# Patient Record
Sex: Female | Born: 1975 | Race: Black or African American | Hispanic: No | Marital: Single | Smoking: Never smoker
Health system: Southern US, Community
[De-identification: ages and names within clinical notes are randomized; demographics above are authoritative.]

## PROBLEM LIST (undated history)

## (undated) DIAGNOSIS — D219 Benign neoplasm of connective and other soft tissue, unspecified: Secondary | ICD-10-CM

## (undated) DIAGNOSIS — G4730 Sleep apnea, unspecified: Secondary | ICD-10-CM

## (undated) DIAGNOSIS — I10 Essential (primary) hypertension: Secondary | ICD-10-CM

## (undated) DIAGNOSIS — D649 Anemia, unspecified: Secondary | ICD-10-CM

## (undated) DIAGNOSIS — T7840XA Allergy, unspecified, initial encounter: Secondary | ICD-10-CM

## (undated) HISTORY — DX: Anemia, unspecified: D64.9

## (undated) HISTORY — DX: Allergy, unspecified, initial encounter: T78.40XA

## (undated) HISTORY — DX: Essential (primary) hypertension: I10

## (undated) HISTORY — DX: Sleep apnea, unspecified: G47.30

---

## 2001-08-26 ENCOUNTER — Other Ambulatory Visit: Admission: RE | Admit: 2001-08-26 | Discharge: 2001-08-26 | Payer: Self-pay | Admitting: Obstetrics and Gynecology

## 2001-10-08 ENCOUNTER — Ambulatory Visit (HOSPITAL_COMMUNITY): Admission: RE | Admit: 2001-10-08 | Discharge: 2001-10-08 | Payer: Self-pay | Admitting: Obstetrics and Gynecology

## 2001-10-08 ENCOUNTER — Encounter: Payer: Self-pay | Admitting: Obstetrics and Gynecology

## 2002-03-04 ENCOUNTER — Other Ambulatory Visit: Admission: RE | Admit: 2002-03-04 | Discharge: 2002-03-04 | Payer: Self-pay | Admitting: Obstetrics and Gynecology

## 2002-10-02 ENCOUNTER — Other Ambulatory Visit: Admission: RE | Admit: 2002-10-02 | Discharge: 2002-10-02 | Payer: Self-pay | Admitting: Obstetrics and Gynecology

## 2002-10-07 ENCOUNTER — Ambulatory Visit (HOSPITAL_COMMUNITY): Admission: RE | Admit: 2002-10-07 | Discharge: 2002-10-07 | Payer: Self-pay | Admitting: Internal Medicine

## 2002-10-07 ENCOUNTER — Encounter: Payer: Self-pay | Admitting: Internal Medicine

## 2003-03-03 ENCOUNTER — Other Ambulatory Visit: Admission: RE | Admit: 2003-03-03 | Discharge: 2003-03-03 | Payer: Self-pay | Admitting: Obstetrics and Gynecology

## 2011-04-11 HISTORY — PX: ROBOT ASSISTED MYOMECTOMY: SHX5142

## 2011-04-11 HISTORY — PX: BILATERAL SALPINGECTOMY: SHX5743

## 2011-09-22 ENCOUNTER — Other Ambulatory Visit (HOSPITAL_COMMUNITY): Payer: Self-pay | Admitting: Obstetrics and Gynecology

## 2011-09-22 DIAGNOSIS — N979 Female infertility, unspecified: Secondary | ICD-10-CM

## 2011-09-25 ENCOUNTER — Ambulatory Visit (HOSPITAL_COMMUNITY)
Admission: RE | Admit: 2011-09-25 | Discharge: 2011-09-25 | Disposition: A | Discharge status: Home / Self Care | Payer: 59 | Source: Ambulatory Visit | Attending: Obstetrics and Gynecology | Admitting: Obstetrics and Gynecology

## 2011-09-25 DIAGNOSIS — N979 Female infertility, unspecified: Secondary | ICD-10-CM

## 2011-09-25 MED ORDER — IOHEXOL 300 MG/ML  SOLN: 17.0000 mL | Freq: Once | INTRAMUSCULAR | Status: AC | PRN

## 2012-10-08 ENCOUNTER — Other Ambulatory Visit (HOSPITAL_COMMUNITY)
Admission: RE | Admit: 2012-10-08 | Discharge: 2012-10-08 | Disposition: A | Discharge status: Home / Self Care | Payer: 59 | Source: Ambulatory Visit | Attending: Obstetrics and Gynecology | Admitting: Obstetrics and Gynecology

## 2012-10-08 ENCOUNTER — Other Ambulatory Visit: Payer: Self-pay | Admitting: Obstetrics and Gynecology

## 2012-10-08 DIAGNOSIS — Z01419 Encounter for gynecological examination (general) (routine) without abnormal findings: Secondary | ICD-10-CM | POA: Insufficient documentation

## 2012-10-08 DIAGNOSIS — N760 Acute vaginitis: Secondary | ICD-10-CM | POA: Insufficient documentation

## 2012-10-08 DIAGNOSIS — Z1151 Encounter for screening for human papillomavirus (HPV): Secondary | ICD-10-CM | POA: Insufficient documentation

## 2015-02-08 ENCOUNTER — Other Ambulatory Visit: Payer: Self-pay | Admitting: Obstetrics and Gynecology

## 2015-02-08 DIAGNOSIS — D259 Leiomyoma of uterus, unspecified: Secondary | ICD-10-CM

## 2015-02-10 ENCOUNTER — Other Ambulatory Visit: Payer: Self-pay | Admitting: Obstetrics and Gynecology

## 2015-02-10 ENCOUNTER — Ambulatory Visit
Admission: RE | Admit: 2015-02-10 | Discharge: 2015-02-10 | Disposition: A | Discharge status: Home / Self Care | Payer: 59 | Source: Ambulatory Visit | Attending: Obstetrics and Gynecology | Admitting: Obstetrics and Gynecology

## 2015-02-10 ENCOUNTER — Encounter (INDEPENDENT_AMBULATORY_CARE_PROVIDER_SITE_OTHER): Payer: Self-pay

## 2015-02-10 DIAGNOSIS — D259 Leiomyoma of uterus, unspecified: Secondary | ICD-10-CM

## 2015-02-10 NOTE — Consult Note (Signed)
Chief Complaint: Patient was seen in consultation today for symptomatically uterine fibroids at the request of Cousins,Sheronette  Referring Physician(s): Cousins,Sheronette  History of Present Illness: [name] is a 39 y.o. (G0, P0) female with past medical history significant for prior bilateral salpingectomy and myomectomy who presents to the interventional radiology clinic for potential percutaneous treatment options regarding her symptomatically uterine fibroids. The patient is unaccompanied and serves as her own historian.  The patient was initially diagnosed with uterine fibroids in 2012. Patient has a long history of menorrhagia though states this has worsened recently. Her cycles have remained regular with no intermittent bleeding. Her cycles typically last approximate 5 days however the first 1-2 days of which are associated with very heavy bleeding necessitating the change of both pads and tampons every 1-2 hours. She admits to the passing of blood clots. She has been previously diagnosed with anemia and has been on iron supplementation though admits she is poorly compliant with taking iron. She has never received a blood transfusion.  The patient admits to urinary frequency though is otherwise without bulk symptoms. She denies significant pelvic or back pain. No hematuria or dysuria. No change in bowel function. No fever or chills.  Patient had a normal Pap smear on 12/18/2014 as well as a negative endometrial biopsy on 02/04/2015. Pelvic ultrasound performed 01/05/2015 (report only, no images) reports a myomatous uterus with at least 4 fibroids, the largest of which within the posterior aspect of the uterus measures approximately 6.3 cm in diameter.  History reviewed. No pertinent past medical history.  Past Surgical History  Procedure Laterality Date  . Bilateral salpingectomy  2013    along with myomectomy  . Robot assisted myomectomy  2013    for uterine  fibroids; along with salpingectomy    Allergies: Review of patient's allergies indicates no known allergies.  Medications: Prior to Admission medications   Medication Sig Start Date End Date Taking? Authorizing Provider  Multiple Vitamin (MULTIVITAMIN) tablet Take 1 tablet by mouth daily.   Yes Historical Provider, MD     No family history on file.  Social History   Social History  . Marital Status: Single    Spouse Name: N/A  . Number of Children: N/A  . Years of Education: N/A   Social History Main Topics  . Smoking status: None  . Smokeless tobacco: None  . Alcohol Use: None  . Drug Use: None  . Sexual Activity: Not Asked   Other Topics Concern  . None   Social History Narrative  . None    ECOG Status: 0 - Asymptomatic  Review of Systems: A 12 point ROS discussed and pertinent positives are indicated in the HPI above.  All other systems are negative.  Review of Systems  Respiratory: Negative.   Cardiovascular: Negative.   Gastrointestinal: Negative.   Genitourinary: Positive for frequency and menstrual problem. Negative for dysuria, flank pain and difficulty urinating.    Vital Signs: BP 142/85 mmHg  Pulse 101  Temp(Src) 98.4 F (36.9 C)  Resp 20  Ht 5\' 4"  (1.626 m)  Wt 205 lb (92.987 kg)  BMI 35.17 kg/m2  SpO2 100%  Physical Exam  Constitutional: She appears well-developed and well-nourished.  HENT:  Head: Normocephalic and atraumatic.  Cardiovascular: Normal rate, regular rhythm and intact distal pulses.   Easily palpable R CFA, DP and PT pulses  Pulmonary/Chest: Effort normal and breath sounds normal.  Abdominal: Soft. Bowel sounds are normal.  I am unable to palpate the uterine fundus.  Skin: Skin is warm and dry.  Psychiatric: She has a normal mood and affect. Her behavior is normal.  Nursing note and vitals reviewed.   Imaging:  Pelvic ultrasound performed 01/05/2015 (report only, no images) reports a myomatous uterus with at least 4  fibroids, the largest of which within the posterior aspect of the uterus measures approximately 6.3 cm in diameter.  Labs:  CBC: No results for input(s): WBC, HGB, HCT, PLT in the last 8760 hours.  COAGS: No results for input(s): INR, APTT in the last 8760 hours.  BMP: No results for input(s): NA, K, CL, CO2, GLUCOSE, BUN, CALCIUM, CREATININE, GFRNONAA, GFRAA in the last 8760 hours.  Invalid input(s): CMP  Assessment and Plan:  [name] is a 39 y.o. (G0, P0) female with past medical history significant for prior bilateral salpingectomy and myomectomy who presents to the interventional radiology clinic for potential percutaneous treatment options regarding her symptomatically uterine fibroids.  The patient has a long history of menorrhagia though states this has worsened recently. Her cycles have remained regular with no intermittent bleeding. Her cycles typically last approximately 5 days however the first 1-2 days of which are associated with very heavy bleeding necessitating the change of both pads and tampons every 1-2 hours. She admits to the passing of blood clots.  She admits to urinary frequency though is otherwise without bulk symptoms.   Note, the patient had a normal Pap smear on 12/18/2014 as well as a negative endometrial biopsy on 02/04/2015.   Prolonged discussions were held with the patient regarding potential treatment options for her symptomatic uterine fibroids including conservative management and hysterectomy. At the present time, the patient wishes to undergo an intervention for her symptomatic fibroids though wishes to avoid surgery. As such, prolonged discussions were held with the patient regarding the benefits and risks (including but not limited to contrast and radiation exposure, bleeding, vessel injury, nontarget embolization) of uterine fibroid embolization. Given the patient's young age, I also explained to the patient that there is a small likelihood  that she could develop recurrent fibroid related symptoms prior to the onset of menopause which may require either a repeat uterine fibroid embolization versus proceeding with hysterectomy.  Following this prolonged discussion, the patient wishes to pursue uterine fibroid embolization. As such, a contrast enhanced pelvic MRI will be obtained to better evaluate the size, burden and location of the fibroids as well as to ensure fibroid enhancement.  As the patient is employed as a Development worker, community carrier, she wishes to postpone the embolization until the beginning of 2017, following the holiday season. The procedure will be performed at [facility] and required overnight admission for continued observation and PCA usage.  Thank you for this interesting consult.  I greatly enjoyed meeting [name] and look forward to participating in her care.  A copy of this report was sent to the requesting provider on this date.  SignedSandi Mariscal 02/10/2015, 2:44 PM   I spent a total of 30 Minutes in face to face in clinical consultation, greater than 50% of which was counseling/coordinating care for symptomatically uterine fibroids

## 2015-02-21 ENCOUNTER — Other Ambulatory Visit: Payer: 59

## 2015-03-02 ENCOUNTER — Ambulatory Visit
Admission: RE | Admit: 2015-03-02 | Discharge: 2015-03-02 | Disposition: A | Discharge status: Home / Self Care | Payer: 59 | Source: Ambulatory Visit | Attending: Obstetrics and Gynecology | Admitting: Obstetrics and Gynecology

## 2015-03-02 DIAGNOSIS — D259 Leiomyoma of uterus, unspecified: Secondary | ICD-10-CM

## 2015-03-02 MED ORDER — GADOBENATE DIMEGLUMINE 529 MG/ML IV SOLN
17.0000 mL | Freq: Once | INTRAVENOUS | Status: AC | PRN
  Administered 2015-03-02: 17 mL via INTRAVENOUS

## 2015-04-13 ENCOUNTER — Other Ambulatory Visit: Payer: Self-pay | Admitting: Interventional Radiology

## 2015-04-13 DIAGNOSIS — D259 Leiomyoma of uterus, unspecified: Secondary | ICD-10-CM

## 2015-05-03 ENCOUNTER — Other Ambulatory Visit: Payer: Self-pay | Admitting: Radiology

## 2015-05-04 ENCOUNTER — Observation Stay (HOSPITAL_COMMUNITY)
Admission: RE | Admit: 2015-05-04 | Discharge: 2015-05-05 | Disposition: A | Discharge status: Home / Self Care | Payer: 59 | Source: Ambulatory Visit | Attending: Interventional Radiology | Admitting: Interventional Radiology

## 2015-05-04 ENCOUNTER — Ambulatory Visit (HOSPITAL_COMMUNITY)
Admission: RE | Admit: 2015-05-04 | Discharge: 2015-05-04 | Disposition: A | Discharge status: Home / Self Care | Payer: 59 | Source: Ambulatory Visit | Attending: Interventional Radiology | Admitting: Interventional Radiology

## 2015-05-04 ENCOUNTER — Encounter (HOSPITAL_COMMUNITY): Payer: Self-pay | Admitting: Anesthesiology

## 2015-05-04 ENCOUNTER — Encounter (HOSPITAL_COMMUNITY): Payer: Self-pay

## 2015-05-04 DIAGNOSIS — Z9079 Acquired absence of other genital organ(s): Secondary | ICD-10-CM | POA: Insufficient documentation

## 2015-05-04 DIAGNOSIS — D219 Benign neoplasm of connective and other soft tissue, unspecified: Secondary | ICD-10-CM | POA: Diagnosis present

## 2015-05-04 DIAGNOSIS — D259 Leiomyoma of uterus, unspecified: Principal | ICD-10-CM | POA: Insufficient documentation

## 2015-05-04 HISTORY — DX: Benign neoplasm of connective and other soft tissue, unspecified: D21.9

## 2015-05-04 LAB — CBC WITH DIFFERENTIAL/PLATELET
BASOS ABS: 0 10*3/uL (ref 0.0–0.1)
Basophils Relative: 0 %
Eosinophils Absolute: 0.3 10*3/uL (ref 0.0–0.7)
Eosinophils Relative: 4 %
HEMATOCRIT: 33.1 % — AB (ref 36.0–46.0)
HEMOGLOBIN: 10.3 g/dL — AB (ref 12.0–15.0)
LYMPHS PCT: 25 %
Lymphs Abs: 1.6 10*3/uL (ref 0.7–4.0)
MCH: 22.9 pg — ABNORMAL LOW (ref 26.0–34.0)
MCHC: 31.1 g/dL (ref 30.0–36.0)
MCV: 73.6 fL — AB (ref 78.0–100.0)
MONO ABS: 0.4 10*3/uL (ref 0.1–1.0)
MONOS PCT: 6 %
NEUTROS ABS: 4.2 10*3/uL (ref 1.7–7.7)
NEUTROS PCT: 65 %
Platelets: 392 10*3/uL (ref 150–400)
RBC: 4.5 MIL/uL (ref 3.87–5.11)
RDW: 16 % — AB (ref 11.5–15.5)
WBC: 6.4 10*3/uL (ref 4.0–10.5)

## 2015-05-04 LAB — BASIC METABOLIC PANEL
Anion gap: 9 (ref 5–15)
BUN: 9 mg/dL (ref 6–20)
CO2: 24 mmol/L (ref 22–32)
Calcium: 9.3 mg/dL (ref 8.9–10.3)
Chloride: 108 mmol/L (ref 101–111)
Creatinine, Ser: 0.85 mg/dL (ref 0.44–1.00)
GFR calc Af Amer: >60 mL/min (ref >60)
GFR calc non Af Amer: >60 mL/min (ref >60)
GLUCOSE: 97 mg/dL (ref 65–99)
POTASSIUM: 3.6 mmol/L (ref 3.5–5.1)
Sodium: 141 mmol/L (ref 135–145)

## 2015-05-04 LAB — APTT: APTT: 30 s (ref 24–37)

## 2015-05-04 LAB — PROTIME-INR
INR: 1.06 (ref 0.00–1.49)
Prothrombin Time: 14 seconds (ref 11.6–15.2)

## 2015-05-04 LAB — HCG, SERUM, QUALITATIVE: Preg, Serum: NEGATIVE

## 2015-05-04 MED ORDER — PROMETHAZINE HCL 25 MG RE SUPP
25.0000 mg | Freq: Three times a day (TID) | RECTAL | Status: DC | PRN
Start: 2015-05-04 — End: 2015-05-05

## 2015-05-04 MED ORDER — IOHEXOL 300 MG/ML  SOLN
80.0000 mL | Freq: Once | INTRAMUSCULAR | Status: AC | PRN
  Administered 2015-05-04: 52 mL via INTRA_ARTERIAL

## 2015-05-04 MED ORDER — SODIUM CHLORIDE 0.9 % IV SOLN: 250.0000 mL | INTRAVENOUS | Status: DC | PRN

## 2015-05-04 MED ORDER — FENTANYL CITRATE (PF) 100 MCG/2ML IJ SOLN
INTRAMUSCULAR | Status: AC | PRN
  Administered 2015-05-04: 50 ug via INTRAVENOUS
  Administered 2015-05-04 (×2): 25 ug via INTRAVENOUS

## 2015-05-04 MED ORDER — MIDAZOLAM HCL 2 MG/2ML IJ SOLN
INTRAMUSCULAR | Status: AC | PRN
  Administered 2015-05-04: 1 mg via INTRAVENOUS
  Administered 2015-05-04 (×2): 0.5 mg via INTRAVENOUS
  Administered 2015-05-04 (×2): 1 mg via INTRAVENOUS

## 2015-05-04 MED ORDER — DIPHENHYDRAMINE HCL 50 MG/ML IJ SOLN: 12.5000 mg | Freq: Four times a day (QID) | INTRAMUSCULAR | Status: DC | PRN

## 2015-05-04 MED ORDER — CEFAZOLIN SODIUM-DEXTROSE 2-3 GM-% IV SOLR
INTRAVENOUS | Status: AC
  Filled 2015-05-04: qty 50

## 2015-05-04 MED ORDER — ONDANSETRON HCL 4 MG/2ML IJ SOLN
4.0000 mg | Freq: Once | INTRAMUSCULAR | Status: AC
  Administered 2015-05-04: 4 mg via INTRAVENOUS
  Filled 2015-05-04: qty 2

## 2015-05-04 MED ORDER — SODIUM CHLORIDE 0.9 % IV SOLN
INTRAVENOUS | Status: DC
Start: 2015-05-04 — End: 2015-05-04
  Administered 2015-05-04: 500 mL via INTRAVENOUS

## 2015-05-04 MED ORDER — HYDROMORPHONE 1 MG/ML IV SOLN
INTRAVENOUS | Status: AC
Start: 2015-05-04 — End: 2015-05-04
  Filled 2015-05-04: qty 25

## 2015-05-04 MED ORDER — PROMETHAZINE HCL 25 MG PO TABS: 25.0000 mg | ORAL_TABLET | Freq: Three times a day (TID) | ORAL | Status: DC | PRN

## 2015-05-04 MED ORDER — DOCUSATE SODIUM 100 MG PO CAPS
100.0000 mg | ORAL_CAPSULE | Freq: Two times a day (BID) | ORAL | Status: DC
  Administered 2015-05-05 (×2): 100 mg via ORAL
  Filled 2015-05-04 (×3): qty 1

## 2015-05-04 MED ORDER — CEFAZOLIN SODIUM-DEXTROSE 2-3 GM-% IV SOLR
2.0000 g | Freq: Once | INTRAVENOUS | Status: AC
  Administered 2015-05-04: 2 g via INTRAVENOUS
  Filled 2015-05-04: qty 50

## 2015-05-04 MED ORDER — SODIUM CHLORIDE 0.9 % IJ SOLN: 9.0000 mL | INTRAMUSCULAR | Status: DC | PRN

## 2015-05-04 MED ORDER — MIDAZOLAM HCL 2 MG/2ML IJ SOLN
INTRAMUSCULAR | Status: AC
  Filled 2015-05-04: qty 6

## 2015-05-04 MED ORDER — HYDROMORPHONE HCL 2 MG/ML IJ SOLN
INTRAMUSCULAR | Status: AC
Start: 2015-05-04 — End: 2015-05-04
  Filled 2015-05-04: qty 1

## 2015-05-04 MED ORDER — SODIUM CHLORIDE 0.9 % IJ SOLN
3.0000 mL | Freq: Two times a day (BID) | INTRAMUSCULAR | Status: DC
  Administered 2015-05-05: 3 mL via INTRAVENOUS

## 2015-05-04 MED ORDER — NALOXONE HCL 0.4 MG/ML IJ SOLN: 0.4000 mg | INTRAMUSCULAR | Status: DC | PRN

## 2015-05-04 MED ORDER — DIPHENHYDRAMINE HCL 12.5 MG/5ML PO ELIX
12.5000 mg | ORAL_SOLUTION | Freq: Four times a day (QID) | ORAL | Status: DC | PRN
  Filled 2015-05-04: qty 5

## 2015-05-04 MED ORDER — ONDANSETRON HCL 4 MG/2ML IJ SOLN
4.0000 mg | Freq: Four times a day (QID) | INTRAMUSCULAR | Status: DC | PRN
  Administered 2015-05-05: 4 mg via INTRAVENOUS
  Filled 2015-05-04: qty 2

## 2015-05-04 MED ORDER — SODIUM CHLORIDE 0.9 % IJ SOLN: 3.0000 mL | INTRAMUSCULAR | Status: DC | PRN

## 2015-05-04 MED ORDER — HYDROMORPHONE HCL 1 MG/ML IJ SOLN
INTRAMUSCULAR | Status: AC | PRN
  Administered 2015-05-04 (×2): 1 mg via INTRAVENOUS

## 2015-05-04 MED ORDER — HYDROMORPHONE 1 MG/ML IV SOLN
INTRAVENOUS | Status: DC
  Administered 2015-05-04: 10:00:00 via INTRAVENOUS
  Administered 2015-05-04: 1.8 mg via INTRAVENOUS
  Administered 2015-05-05: 2.1 mg via INTRAVENOUS
  Administered 2015-05-05: 1.8 mg via INTRAVENOUS
  Administered 2015-05-05: 2.4 mg via INTRAVENOUS
  Administered 2015-05-05: 0.3 mg via INTRAVENOUS
  Administered 2015-05-05: 3.3 mg via INTRAVENOUS
  Filled 2015-05-04: qty 25

## 2015-05-04 MED ORDER — FENTANYL CITRATE (PF) 100 MCG/2ML IJ SOLN
INTRAMUSCULAR | Status: AC
  Filled 2015-05-04: qty 4

## 2015-05-04 MED ORDER — KETOROLAC TROMETHAMINE 30 MG/ML IJ SOLN
30.0000 mg | Freq: Once | INTRAMUSCULAR | Status: AC
  Administered 2015-05-04: 30 mg via INTRAVENOUS
  Filled 2015-05-04: qty 1

## 2015-05-04 MED ORDER — LIDOCAINE-EPINEPHRINE 2 %-1:100000 IJ SOLN
INTRAMUSCULAR | Status: AC
  Filled 2015-05-04: qty 1

## 2015-05-04 NOTE — Sedation Documentation (Signed)
R groin level 0, drsg CDI, 3+RDP. Pt transported in bed to rm [id number].

## 2015-05-04 NOTE — Sedation Documentation (Addendum)
R groin level 0, drsg CDI, 3+RDP.

## 2015-05-04 NOTE — Sedation Documentation (Signed)
Bed assignment received [id number].  Attempted to call report, nurse unavailable, will report at bedside.

## 2015-05-04 NOTE — Procedures (Signed)
Post UFE.   No immediate post procedural complications.   EBL: None Keep right leg straight for 4 hrs.    SignedSandi Mariscal [id number] 05/04/2015, 10:30 AM

## 2015-05-04 NOTE — Sedation Documentation (Signed)
R groin level 0, drsg CDI, 3+RDP.

## 2015-05-04 NOTE — Sedation Documentation (Signed)
Awaiting bed assignment.  Spoke with Margo in placement.  Room being cleaned.

## 2015-05-04 NOTE — Progress Notes (Signed)
Patient ID: [name], female   DOB: April 10, 1976, 40 y.o.   MRN: [id number]    Referring Physician(s): Cousins,S  Chief Complaint:  Uterine fibroids, menorrhagia  Subjective:  Pt doing well; has some intermittent pelvic cramping as expected; denies N/V or resp difficulties  Allergies: Review of patient's allergies indicates no known allergies.  Medications: Prior to Admission medications   Medication Sig Start Date End Date Taking? Authorizing Provider  Ascorbic Acid (VITAMIN C PO) Take by mouth daily.   Yes Historical Provider, MD  Cholecalciferol (VITAMIN D PO) Take by mouth daily.   Yes Historical Provider, MD  Multiple Vitamin (MULTIVITAMIN) tablet Take 1 tablet by mouth daily.   Yes Historical Provider, MD  VITAMIN E PO Take by mouth daily.   Yes Historical Provider, MD     Vital Signs: BP 135/82 mmHg  Pulse 76  Temp(Src) 98.6 F (37 C) (Oral)  Resp 17  Ht _0  (1.626 m)  Wt 197 lb 8 oz (89.585 kg)  BMI 33.88 kg/m2  SpO2 100%  LMP 04/21/2015 (Approximate)  Physical Exam awake/alert; abd soft, puncture site rt CFA clean and dry, NT, no hematoma, intact distal pulses; foley cath with yellow urine  Imaging: Ir Angiogram Pelvis Selective Or Supraselective  05/04/2015  INDICATION: Symptomatic uterine fibroids. Patient presents for bilateral uterine artery embolization EXAM: Uterine Fibroid Embolization MEDICATIONS: Versed 4 mg IV; Toradol 30 mg IV; Zofran 4 mg IV; Ancef 2 g IV; the antibiotic was administered within one hour of the procedure ANESTHESIA/SEDATION: Fentanyl 100 mcg IV; Versed 4 mg IV; Sedation time 47 minutes The patient was continuously monitored during the procedure by the interventional radiology nurse under my direct supervision. CONTRAST:  52 mL OMNIPAQUE IOHEXOL 300 MG/ML  SOLN FLUOROSCOPY TIME:  11 minutes 54 seconds (8,250 mGy) COMPLICATIONS: None immediate. PROCEDURE: Informed consent was obtained from the patient following explanation of  the procedure, risks, benefits and alternatives. The patient understands, agrees and consents for the procedure. All questions were addressed. A time out was performed prior to the initiation of the procedure. Maximal barrier sterile technique utilized including caps, mask, sterile gowns, sterile gloves, large sterile drape, hand hygiene, and Betadine prep. The right femoral head was marked fluoroscopically. Under sterile conditions and local anesthesia, the right common femoral artery access was performed with a micropuncture needle. Under direct ultrasound guidance, the right common femoral was accessed with a micropuncture kit. An ultrasound image was saved for documentation purposes. This allowed for placement of a 5-French vascular sheath. A limited arteriogram was performed through the side arm of the sheath confirming appropriate access within the right common femoral artery. The 5-French C2 catheter was utilized to select the contralateral left internal iliac artery. Selective left internal iliac angiogram was performed. The tortuous left uterine artery was identified. Selective catheterization was performed of the left uterine artery with a microcatheter and micro guide wire. A selective left uterine angiogram was performed. This demonstrated patency of the left uterine artery. Mild diffuse hypervascularity of the enlarged fibroid uterus. Access was adequate for embolization. For embolization, 1 vial of 500 - 700 micron Embospheres were injected into the left uterine artery. Post embolization angiogram confirms complete stasis of the left uterine vascular territory. Microcatheter was removed. The C2 catheter was retracted and utilized to select the right internal iliac artery. Selective right internal iliac angiogram was performed. The patent right uterine artery was identified. For selective catheterization, the micro catheter and guidewire were utilized to select the right uterine artery.  Selective right  uterine angiogram was performed. This demonstrated patency of the right uterine artery. Catheter position was safe for embolization. Embolization was performed to complete stasis with injection of 1 and 1/3 of vials of 500-700 micron Embospheres. Post embolization angiogram confirms complete stasis of the right uterine vascular territory. At this point, all wires, catheters and sheaths were removed from the patient. Hemostasis was achieved at the right groin access site with The patient tolerated the procedure well without immediate post procedural complication. FINDINGS: Bilateral internal iliac arteriograms demonstrates conventional configuration of the bilateral internal iliac arteries, both of which give rise to hypertrophied uterine arteries which supply a myomatous uterus. Note was made of a slight right sided uterine artery dominance. Completion arteriograms following bilateral uterine artery particle embolization demonstrates a technically excellent result with stasis of flow within the bilateral uterine vascular territories. IMPRESSION: Successful bilateral uterine artery embolization (U [facility] E). Electronically Signed   By: Sandi Mariscal M.D.   On: 05/04/2015 10:59   Ir Angiogram Pelvis Selective Or Supraselective  05/04/2015  INDICATION: Symptomatic uterine fibroids. Patient presents for bilateral uterine artery embolization EXAM: Uterine Fibroid Embolization MEDICATIONS: Versed 4 mg IV; Toradol 30 mg IV; Zofran 4 mg IV; Ancef 2 g IV; the antibiotic was administered within one hour of the procedure ANESTHESIA/SEDATION: Fentanyl 100 mcg IV; Versed 4 mg IV; Sedation time 47 minutes The patient was continuously monitored during the procedure by the interventional radiology nurse under my direct supervision. CONTRAST:  52 mL OMNIPAQUE IOHEXOL 300 MG/ML  SOLN FLUOROSCOPY TIME:  11 minutes 54 seconds (3,734 mGy) COMPLICATIONS: None immediate. PROCEDURE: Informed consent was obtained from the patient following  explanation of the procedure, risks, benefits and alternatives. The patient understands, agrees and consents for the procedure. All questions were addressed. A time out was performed prior to the initiation of the procedure. Maximal barrier sterile technique utilized including caps, mask, sterile gowns, sterile gloves, large sterile drape, hand hygiene, and Betadine prep. The right femoral head was marked fluoroscopically. Under sterile conditions and local anesthesia, the right common femoral artery access was performed with a micropuncture needle. Under direct ultrasound guidance, the right common femoral was accessed with a micropuncture kit. An ultrasound image was saved for documentation purposes. This allowed for placement of a 5-French vascular sheath. A limited arteriogram was performed through the side arm of the sheath confirming appropriate access within the right common femoral artery. The 5-French C2 catheter was utilized to select the contralateral left internal iliac artery. Selective left internal iliac angiogram was performed. The tortuous left uterine artery was identified. Selective catheterization was performed of the left uterine artery with a microcatheter and micro guide wire. A selective left uterine angiogram was performed. This demonstrated patency of the left uterine artery. Mild diffuse hypervascularity of the enlarged fibroid uterus. Access was adequate for embolization. For embolization, 1 vial of 500 - 700 micron Embospheres were injected into the left uterine artery. Post embolization angiogram confirms complete stasis of the left uterine vascular territory. Microcatheter was removed. The C2 catheter was retracted and utilized to select the right internal iliac artery. Selective right internal iliac angiogram was performed. The patent right uterine artery was identified. For selective catheterization, the micro catheter and guidewire were utilized to select the right uterine artery.  Selective right uterine angiogram was performed. This demonstrated patency of the right uterine artery. Catheter position was safe for embolization. Embolization was performed to complete stasis with injection of 1 and 1/3 of vials of 500-700  micron Embospheres. Post embolization angiogram confirms complete stasis of the right uterine vascular territory. At this point, all wires, catheters and sheaths were removed from the patient. Hemostasis was achieved at the right groin access site with The patient tolerated the procedure well without immediate post procedural complication. FINDINGS: Bilateral internal iliac arteriograms demonstrates conventional configuration of the bilateral internal iliac arteries, both of which give rise to hypertrophied uterine arteries which supply a myomatous uterus. Note was made of a slight right sided uterine artery dominance. Completion arteriograms following bilateral uterine artery particle embolization demonstrates a technically excellent result with stasis of flow within the bilateral uterine vascular territories. IMPRESSION: Successful bilateral uterine artery embolization ([facility] E). Electronically Signed   By: Sandi Mariscal M.D.   On: 05/04/2015 10:59   Ir Angiogram Selective Each Additional Vessel  05/04/2015  INDICATION: Symptomatic uterine fibroids. Patient presents for bilateral uterine artery embolization EXAM: Uterine Fibroid Embolization MEDICATIONS: Versed 4 mg IV; Toradol 30 mg IV; Zofran 4 mg IV; Ancef 2 g IV; the antibiotic was administered within one hour of the procedure ANESTHESIA/SEDATION: Fentanyl 100 mcg IV; Versed 4 mg IV; Sedation time 47 minutes The patient was continuously monitored during the procedure by the interventional radiology nurse under my direct supervision. CONTRAST:  52 mL OMNIPAQUE IOHEXOL 300 MG/ML  SOLN FLUOROSCOPY TIME:  11 minutes 54 seconds (3,143 mGy) COMPLICATIONS: None immediate. PROCEDURE: Informed consent was obtained from the patient  following explanation of the procedure, risks, benefits and alternatives. The patient understands, agrees and consents for the procedure. All questions were addressed. A time out was performed prior to the initiation of the procedure. Maximal barrier sterile technique utilized including caps, mask, sterile gowns, sterile gloves, large sterile drape, hand hygiene, and Betadine prep. The right femoral head was marked fluoroscopically. Under sterile conditions and local anesthesia, the right common femoral artery access was performed with a micropuncture needle. Under direct ultrasound guidance, the right common femoral was accessed with a micropuncture kit. An ultrasound image was saved for documentation purposes. This allowed for placement of a 5-French vascular sheath. A limited arteriogram was performed through the side arm of the sheath confirming appropriate access within the right common femoral artery. The 5-French C2 catheter was utilized to select the contralateral left internal iliac artery. Selective left internal iliac angiogram was performed. The tortuous left uterine artery was identified. Selective catheterization was performed of the left uterine artery with a microcatheter and micro guide wire. A selective left uterine angiogram was performed. This demonstrated patency of the left uterine artery. Mild diffuse hypervascularity of the enlarged fibroid uterus. Access was adequate for embolization. For embolization, 1 vial of 500 - 700 micron Embospheres were injected into the left uterine artery. Post embolization angiogram confirms complete stasis of the left uterine vascular territory. Microcatheter was removed. The C2 catheter was retracted and utilized to select the right internal iliac artery. Selective right internal iliac angiogram was performed. The patent right uterine artery was identified. For selective catheterization, the micro catheter and guidewire were utilized to select the right uterine  artery. Selective right uterine angiogram was performed. This demonstrated patency of the right uterine artery. Catheter position was safe for embolization. Embolization was performed to complete stasis with injection of 1 and 1/3 of vials of 500-700 micron Embospheres. Post embolization angiogram confirms complete stasis of the right uterine vascular territory. At this point, all wires, catheters and sheaths were removed from the patient. Hemostasis was achieved at the right groin access site with  The patient tolerated the procedure well without immediate post procedural complication. FINDINGS: Bilateral internal iliac arteriograms demonstrates conventional configuration of the bilateral internal iliac arteries, both of which give rise to hypertrophied uterine arteries which supply a myomatous uterus. Note was made of a slight right sided uterine artery dominance. Completion arteriograms following bilateral uterine artery particle embolization demonstrates a technically excellent result with stasis of flow within the bilateral uterine vascular territories. IMPRESSION: Successful bilateral uterine artery embolization ([facility] E). Electronically Signed   By: Sandi Mariscal M.D.   On: 05/04/2015 10:59   Ir Angiogram Selective Each Additional Vessel  05/04/2015  INDICATION: Symptomatic uterine fibroids. Patient presents for bilateral uterine artery embolization EXAM: Uterine Fibroid Embolization MEDICATIONS: Versed 4 mg IV; Toradol 30 mg IV; Zofran 4 mg IV; Ancef 2 g IV; the antibiotic was administered within one hour of the procedure ANESTHESIA/SEDATION: Fentanyl 100 mcg IV; Versed 4 mg IV; Sedation time 47 minutes The patient was continuously monitored during the procedure by the interventional radiology nurse under my direct supervision. CONTRAST:  52 mL OMNIPAQUE IOHEXOL 300 MG/ML  SOLN FLUOROSCOPY TIME:  11 minutes 54 seconds (3,086 mGy) COMPLICATIONS: None immediate. PROCEDURE: Informed consent was obtained from the  patient following explanation of the procedure, risks, benefits and alternatives. The patient understands, agrees and consents for the procedure. All questions were addressed. A time out was performed prior to the initiation of the procedure. Maximal barrier sterile technique utilized including caps, mask, sterile gowns, sterile gloves, large sterile drape, hand hygiene, and Betadine prep. The right femoral head was marked fluoroscopically. Under sterile conditions and local anesthesia, the right common femoral artery access was performed with a micropuncture needle. Under direct ultrasound guidance, the right common femoral was accessed with a micropuncture kit. An ultrasound image was saved for documentation purposes. This allowed for placement of a 5-French vascular sheath. A limited arteriogram was performed through the side arm of the sheath confirming appropriate access within the right common femoral artery. The 5-French C2 catheter was utilized to select the contralateral left internal iliac artery. Selective left internal iliac angiogram was performed. The tortuous left uterine artery was identified. Selective catheterization was performed of the left uterine artery with a microcatheter and micro guide wire. A selective left uterine angiogram was performed. This demonstrated patency of the left uterine artery. Mild diffuse hypervascularity of the enlarged fibroid uterus. Access was adequate for embolization. For embolization, 1 vial of 500 - 700 micron Embospheres were injected into the left uterine artery. Post embolization angiogram confirms complete stasis of the left uterine vascular territory. Microcatheter was removed. The C2 catheter was retracted and utilized to select the right internal iliac artery. Selective right internal iliac angiogram was performed. The patent right uterine artery was identified. For selective catheterization, the micro catheter and guidewire were utilized to select the right  uterine artery. Selective right uterine angiogram was performed. This demonstrated patency of the right uterine artery. Catheter position was safe for embolization. Embolization was performed to complete stasis with injection of 1 and 1/3 of vials of 500-700 micron Embospheres. Post embolization angiogram confirms complete stasis of the right uterine vascular territory. At this point, all wires, catheters and sheaths were removed from the patient. Hemostasis was achieved at the right groin access site with The patient tolerated the procedure well without immediate post procedural complication. FINDINGS: Bilateral internal iliac arteriograms demonstrates conventional configuration of the bilateral internal iliac arteries, both of which give rise to hypertrophied uterine arteries which supply a  myomatous uterus. Note was made of a slight right sided uterine artery dominance. Completion arteriograms following bilateral uterine artery particle embolization demonstrates a technically excellent result with stasis of flow within the bilateral uterine vascular territories. IMPRESSION: Successful bilateral uterine artery embolization ([facility] E). Electronically Signed   By: Sandi Mariscal M.D.   On: 05/04/2015 10:59   Ir US Guide Vasc Access Right  05/04/2015  INDICATION: Symptomatic uterine fibroids. Patient presents for bilateral uterine artery embolization EXAM: Uterine Fibroid Embolization MEDICATIONS: Versed 4 mg IV; Toradol 30 mg IV; Zofran 4 mg IV; Ancef 2 g IV; the antibiotic was administered within one hour of the procedure ANESTHESIA/SEDATION: Fentanyl 100 mcg IV; Versed 4 mg IV; Sedation time 47 minutes The patient was continuously monitored during the procedure by the interventional radiology nurse under my direct supervision. CONTRAST:  52 mL OMNIPAQUE IOHEXOL 300 MG/ML  SOLN FLUOROSCOPY TIME:  11 minutes 54 seconds (8,416 mGy) COMPLICATIONS: None immediate. PROCEDURE: Informed consent was obtained from the patient  following explanation of the procedure, risks, benefits and alternatives. The patient understands, agrees and consents for the procedure. All questions were addressed. A time out was performed prior to the initiation of the procedure. Maximal barrier sterile technique utilized including caps, mask, sterile gowns, sterile gloves, large sterile drape, hand hygiene, and Betadine prep. The right femoral head was marked fluoroscopically. Under sterile conditions and local anesthesia, the right common femoral artery access was performed with a micropuncture needle. Under direct ultrasound guidance, the right common femoral was accessed with a micropuncture kit. An ultrasound image was saved for documentation purposes. This allowed for placement of a 5-French vascular sheath. A limited arteriogram was performed through the side arm of the sheath confirming appropriate access within the right common femoral artery. The 5-French C2 catheter was utilized to select the contralateral left internal iliac artery. Selective left internal iliac angiogram was performed. The tortuous left uterine artery was identified. Selective catheterization was performed of the left uterine artery with a microcatheter and micro guide wire. A selective left uterine angiogram was performed. This demonstrated patency of the left uterine artery. Mild diffuse hypervascularity of the enlarged fibroid uterus. Access was adequate for embolization. For embolization, 1 vial of 500 - 700 micron Embospheres were injected into the left uterine artery. Post embolization angiogram confirms complete stasis of the left uterine vascular territory. Microcatheter was removed. The C2 catheter was retracted and utilized to select the right internal iliac artery. Selective right internal iliac angiogram was performed. The patent right uterine artery was identified. For selective catheterization, the micro catheter and guidewire were utilized to select the right uterine  artery. Selective right uterine angiogram was performed. This demonstrated patency of the right uterine artery. Catheter position was safe for embolization. Embolization was performed to complete stasis with injection of 1 and 1/3 of vials of 500-700 micron Embospheres. Post embolization angiogram confirms complete stasis of the right uterine vascular territory. At this point, all wires, catheters and sheaths were removed from the patient. Hemostasis was achieved at the right groin access site with The patient tolerated the procedure well without immediate post procedural complication. FINDINGS: Bilateral internal iliac arteriograms demonstrates conventional configuration of the bilateral internal iliac arteries, both of which give rise to hypertrophied uterine arteries which supply a myomatous uterus. Note was made of a slight right sided uterine artery dominance. Completion arteriograms following bilateral uterine artery particle embolization demonstrates a technically excellent result with stasis of flow within the bilateral uterine vascular territories. IMPRESSION:  Successful bilateral uterine artery embolization ([facility] E). Electronically Signed   By: Sandi Mariscal M.D.   On: 05/04/2015 10:59   Ir Embo Tumor Organ Ischemia Infarct Inc Guide Roadmapping  05/04/2015  INDICATION: Symptomatic uterine fibroids. Patient presents for bilateral uterine artery embolization EXAM: Uterine Fibroid Embolization MEDICATIONS: Versed 4 mg IV; Toradol 30 mg IV; Zofran 4 mg IV; Ancef 2 g IV; the antibiotic was administered within one hour of the procedure ANESTHESIA/SEDATION: Fentanyl 100 mcg IV; Versed 4 mg IV; Sedation time 47 minutes The patient was continuously monitored during the procedure by the interventional radiology nurse under my direct supervision. CONTRAST:  52 mL OMNIPAQUE IOHEXOL 300 MG/ML  SOLN FLUOROSCOPY TIME:  11 minutes 54 seconds (7,342 mGy) COMPLICATIONS: None immediate. PROCEDURE: Informed consent was  obtained from the patient following explanation of the procedure, risks, benefits and alternatives. The patient understands, agrees and consents for the procedure. All questions were addressed. A time out was performed prior to the initiation of the procedure. Maximal barrier sterile technique utilized including caps, mask, sterile gowns, sterile gloves, large sterile drape, hand hygiene, and Betadine prep. The right femoral head was marked fluoroscopically. Under sterile conditions and local anesthesia, the right common femoral artery access was performed with a micropuncture needle. Under direct ultrasound guidance, the right common femoral was accessed with a micropuncture kit. An ultrasound image was saved for documentation purposes. This allowed for placement of a 5-French vascular sheath. A limited arteriogram was performed through the side arm of the sheath confirming appropriate access within the right common femoral artery. The 5-French C2 catheter was utilized to select the contralateral left internal iliac artery. Selective left internal iliac angiogram was performed. The tortuous left uterine artery was identified. Selective catheterization was performed of the left uterine artery with a microcatheter and micro guide wire. A selective left uterine angiogram was performed. This demonstrated patency of the left uterine artery. Mild diffuse hypervascularity of the enlarged fibroid uterus. Access was adequate for embolization. For embolization, 1 vial of 500 - 700 micron Embospheres were injected into the left uterine artery. Post embolization angiogram confirms complete stasis of the left uterine vascular territory. Microcatheter was removed. The C2 catheter was retracted and utilized to select the right internal iliac artery. Selective right internal iliac angiogram was performed. The patent right uterine artery was identified. For selective catheterization, the micro catheter and guidewire were utilized  to select the right uterine artery. Selective right uterine angiogram was performed. This demonstrated patency of the right uterine artery. Catheter position was safe for embolization. Embolization was performed to complete stasis with injection of 1 and 1/3 of vials of 500-700 micron Embospheres. Post embolization angiogram confirms complete stasis of the right uterine vascular territory. At this point, all wires, catheters and sheaths were removed from the patient. Hemostasis was achieved at the right groin access site with The patient tolerated the procedure well without immediate post procedural complication. FINDINGS: Bilateral internal iliac arteriograms demonstrates conventional configuration of the bilateral internal iliac arteries, both of which give rise to hypertrophied uterine arteries which supply a myomatous uterus. Note was made of a slight right sided uterine artery dominance. Completion arteriograms following bilateral uterine artery particle embolization demonstrates a technically excellent result with stasis of flow within the bilateral uterine vascular territories. IMPRESSION: Successful bilateral uterine artery embolization (U F E). Electronically Signed   By: Sandi Mariscal M.D.   On: 05/04/2015 10:59    Labs:  CBC:  Recent Labs  05/04/15 0750  WBC 6.4  HGB 10.3*  HCT 33.1*  PLT 392    COAGS:  Recent Labs  05/04/15 0750  INR 1.06  APTT 30    BMP:  Recent Labs  05/04/15 0750  NA 141  K 3.6  CL 108  CO2 24  GLUCOSE 97  BUN 9  CALCIUM 9.3  CREATININE 0.85  GFRNONAA >60  GFRAA >60    LIVER FUNCTION TESTS: No results for input(s): BILITOT, AST, ALT, ALKPHOS, PROT, ALBUMIN in the last 8760 hours.  Assessment and Plan: Pt with symptomatic uterine fibroids, s/p bilat [place] 1/24; for overnight obs; dilaudid PCA for pain; hydrate; d/c foley soon; f/u in IR clinic in 4 weeks  Electronically Signed: D. Rowe Robert 05/04/2015, 4:54 PM   I spent a total of 15  minutes at the the patient's bedside AND on the patient's hospital floor or unit, greater than 50% of which was counseling/coordinating care for uterine fibroid embolization

## 2015-05-04 NOTE — Sedation Documentation (Signed)
5 Fr sheath removed from R femoral artery by Cherlyn Labella, RTR.  Hemostasis achieved using manual pressure. R groin level 0, +RDP.

## 2015-05-04 NOTE — H&P (Signed)
Chief Complaint: menorrhagia, uterine fibroids Referring Physician:Dr. Servando Salina HPI: [name] is an 40 y.o. female who was seen by Dr. Pascal Lux in the clinic in November due to bleeding uterine fibroids.  She had an MRI which confirmed this.  She would like to avoid an operation and therefore a bilateral [place] was arranged.  She has presented today for this procedure.  Past Medical History:  Past Medical History  Diagnosis Date  . Fibroids     Past Surgical History:  Past Surgical History  Procedure Laterality Date  . Bilateral salpingectomy  2013    along with myomectomy  . Robot assisted myomectomy  2013    for uterine fibroids; along with salpingectomy    Family History: History reviewed. No pertinent family history.  Social History:  reports that she has never smoked. She does not have any smokeless tobacco history on file. She reports that she drinks about 1.2 oz of alcohol per week. She reports that she does not use illicit drugs.  Allergies: No Known Allergies  Medications:   Medication List    ASK your doctor about these medications        multivitamin tablet  Take 1 tablet by mouth daily.       ROS: Please HPI for pertinent positives, otherwise complete 10 system ROS negative.  Mallampati Score: MD Evaluation Airway: WNL Heart: WNL Abdomen: WNL Chest/ Lungs: WNL ASA  Classification: 2 Mallampati/Airway Score: Two  Physical Exam: BP 163/97 mmHg  Pulse 88  Temp(Src) 98.6 F (37 C) (Oral)  Resp 16  Ht _0  (1.626 m)  Wt 197 lb 8 oz (89.585 kg)  BMI 33.88 kg/m2  SpO2 100%  LMP 04/21/2015 (Approximate) Body mass index is 33.88 kg/(m^2).  General: pleasant, obese black female who is laying in bed in NAD Heart: regular, rate, and rhythm.  Normal s1,s2. No obvious murmurs, gallops, or rubs noted.  Palpable radial and pedal pulses bilaterally Lungs: CTAB, no wheezes, rhonchi, or rales noted.  Respiratory effort nonlabored Abd:  soft, NT, ND, +BS, no masses, hernias, or organomegaly Psych: A&Ox3 with an appropriate affect.   Labs: Results for orders placed or performed during the hospital encounter of 05/04/15 (from the past 48 hour(s))  APTT     Status: None   Collection Time: 05/04/15  7:50 AM  Result Value Ref Range   aPTT 30 24 - 37 seconds  Basic metabolic panel     Status: None   Collection Time: 05/04/15  7:50 AM  Result Value Ref Range   Sodium 141 135 - 145 mmol/L   Potassium 3.6 3.5 - 5.1 mmol/L   Chloride 108 101 - 111 mmol/L   CO2 24 22 - 32 mmol/L   Glucose, Bld 97 65 - 99 mg/dL   BUN 9 6 - 20 mg/dL   Creatinine, Ser 0.85 0.44 - 1.00 mg/dL   Calcium 9.3 8.9 - 10.3 mg/dL   GFR calc non Af Amer >60 >60 mL/min   GFR calc Af Amer >60 >60 mL/min    Comment: (NOTE) The eGFR has been calculated using the CKD EPI equation. This calculation has not been validated in all clinical situations. eGFR's persistently <60 mL/min signify possible Chronic Kidney Disease.    Anion gap 9 5 - 15  CBC with Differential/Platelet     Status: Abnormal   Collection Time: 05/04/15  7:50 AM  Result Value Ref Range   WBC 6.4 4.0 - 10.5 K/uL   RBC 4.50  3.87 - 5.11 MIL/uL   Hemoglobin 10.3 (L) 12.0 - 15.0 g/dL   HCT 33.1 (L) 36.0 - 46.0 %   MCV 73.6 (L) 78.0 - 100.0 fL   MCH 22.9 (L) 26.0 - 34.0 pg   MCHC 31.1 30.0 - 36.0 g/dL   RDW 16.0 (H) 11.5 - 15.5 %   Platelets 392 150 - 400 K/uL   Neutrophils Relative % 65 %   Neutro Abs 4.2 1.7 - 7.7 K/uL   Lymphocytes Relative 25 %   Lymphs Abs 1.6 0.7 - 4.0 K/uL   Monocytes Relative 6 %   Monocytes Absolute 0.4 0.1 - 1.0 K/uL   Eosinophils Relative 4 %   Eosinophils Absolute 0.3 0.0 - 0.7 K/uL   Basophils Relative 0 %   Basophils Absolute 0.0 0.0 - 0.1 K/uL  hCG, serum, qualitative     Status: None   Collection Time: 05/04/15  7:50 AM  Result Value Ref Range   Preg, Serum NEGATIVE NEGATIVE    Comment:        THE SENSITIVITY OF THIS METHODOLOGY IS >10  mIU/mL.   Protime-INR     Status: None   Collection Time: 05/04/15  7:50 AM  Result Value Ref Range   Prothrombin Time 14.0 11.6 - 15.2 seconds   INR 1.06 0.00 - 1.49    Imaging: No results found.  Assessment/Plan 1. Menorrhagia/uterine fibroids  Plan for bilateral [place] today.  Admission overnight for observation and pain control.   The procedure, risks, and complications were explained to the patient, including infection, bleeding, vessel injury, nontarget embolization, nausea, vomiting, or anesthetic complications.  The patient understands and is agreeable to proceed with the procedure.  Thank you for this interesting consult.  I greatly enjoyed meeting [name] and look forward to participating in their care.  A copy of this report was sent to the requesting provider on this date.  Electronically Signed: Henreitta Cea 05/04/2015, 8:53 AM   I spent a total of  30 Minutes   in face to face in clinical consultation, greater than 50% of which was counseling/coordinating care for menorrhagia, uterine fibroids

## 2015-05-04 NOTE — Sedation Documentation (Signed)
Gauze/tegaderm bandage applied to R femoral artery puncture site.  Dressing CDI, groin level 0, 3+RDP.

## 2015-05-05 DIAGNOSIS — D259 Leiomyoma of uterus, unspecified: Secondary | ICD-10-CM | POA: Diagnosis not present

## 2015-05-05 MED ORDER — IBUPROFEN 600 MG PO TABS
600.0000 mg | ORAL_TABLET | Freq: Once | ORAL | Status: AC
  Administered 2015-05-05: 600 mg via ORAL
  Filled 2015-05-05: qty 3
  Filled 2015-05-05: qty 1

## 2015-05-05 NOTE — Progress Notes (Signed)
Discharge instructions and prescriptions given to patient questions answered 

## 2015-05-05 NOTE — Discharge Summary (Signed)
Patient ID: [name] MRN: [id number] DOB/AGE: [birth year] 40 y.o.  Admit date: 05/04/2015 Discharge date: 05/05/2015  Admission Diagnoses: Symptomatic uterine fibroids  Discharge Diagnoses:  Active Problems:   Fibroid   Discharged Condition: good  Hospital Course: [name] is a 40 y.o. (G0, P0) female with past medical history significant for prior bilateral salpingectomy and myomectomy who presented to the interventional radiology clinic to plan percutaneous treatment of her symptomatic uterine fibroids.  The patient was initially diagnosed with uterine fibroids in 2012.   She had a long history of menorrhagia which had worsened recently.  She admited to the passing of blood clots. She has been previously diagnosed with anemia and has been on iron supplementation though admits she is poorly compliant with taking iron. She had never required a blood transfusion.  On 05/04/2015 She underwent Bilateral internal iliac arteriograms which showed both gave rise to hypertrophied uterine arteries which supply a myomatous uterus.  Completion arteriograms following bilateral uterine artery particle embolization demonstrateed a technically excellent result with stasis of flow within the bilateral uterine vascular territories.  The procedure was done by Dr. Pascal Lux.  She did very well post-operatively.  She is tolerating PO.  She has voided without difficulty.  She is ambulating well.  Pain is controlled with Ibuprofen.   Consults: None  Significant Diagnostic Studies: None  Treatments:  INDICATION: Symptomatic uterine fibroids. Patient presents for bilateral uterine artery embolization  EXAM: Uterine Fibroid Embolization  MEDICATIONS: Versed 4 mg IV; Toradol 30 mg IV; Zofran 4 mg IV; Ancef 2 g IV; the antibiotic was administered within one hour of the procedure  ANESTHESIA/SEDATION: Fentanyl 100 mcg IV; Versed 4 mg IV;  Sedation time 47  minutes  The patient was continuously monitored during the procedure by the interventional radiology nurse under my direct supervision.  CONTRAST: 52 mL OMNIPAQUE IOHEXOL 300 MG/ML SOLN  FLUOROSCOPY TIME: 11 minutes 54 seconds (9,379 mGy)  COMPLICATIONS: None immediate.  PROCEDURE: Informed consent was obtained from the patient following explanation of the procedure, risks, benefits and alternatives. The patient understands, agrees and consents for the procedure. All questions were addressed. A time out was performed prior to the initiation of the procedure. Maximal barrier sterile technique utilized including caps, mask, sterile gowns, sterile gloves, large sterile drape, hand hygiene, and Betadine prep.  The right femoral head was marked fluoroscopically. Under sterile conditions and local anesthesia, the right common femoral artery access was performed with a micropuncture needle. Under direct ultrasound guidance, the right common femoral was accessed with a micropuncture kit. An ultrasound image was saved for documentation purposes. This allowed for placement of a 5-French vascular sheath. A limited arteriogram was performed through the side arm of the sheath confirming appropriate access within the right common femoral artery.  The 5-French C2 catheter was utilized to select the contralateral left internal iliac artery. Selective left internal iliac angiogram was performed. The tortuous left uterine artery was identified. Selective catheterization was performed of the left uterine artery with a microcatheter and micro guide wire. A selective left uterine angiogram was performed. This demonstrated patency of the left uterine artery. Mild diffuse hypervascularity of the enlarged fibroid uterus. Access was adequate for embolization. For embolization, 1 vial of 500 - 700 micron Embospheres were injected into the left uterine artery. Post embolization angiogram  confirms complete stasis of the left uterine vascular territory. Microcatheter was removed.  The C2 catheter was retracted and utilized to select the right internal iliac artery. Selective right  internal iliac angiogram was performed. The patent right uterine artery was identified. For selective catheterization, the micro catheter and guidewire were utilized to select the right uterine artery. Selective right uterine angiogram was performed. This demonstrated patency of the right uterine artery. Catheter position was safe for embolization. Embolization was performed to complete stasis with injection of 1 and 1/3 of vials of 500-700 micron Embospheres. Post embolization angiogram confirms complete stasis of the right uterine vascular territory.  At this point, all wires, catheters and sheaths were removed from the patient. Hemostasis was achieved at the right groin access site with  The patient tolerated the procedure well without immediate post procedural complication.  FINDINGS: Bilateral internal iliac arteriograms demonstrates conventional configuration of the bilateral internal iliac arteries, both of which give rise to hypertrophied uterine arteries which supply a myomatous uterus. Note was made of a slight right sided uterine artery dominance.  Completion arteriograms following bilateral uterine artery particle embolization demonstrates a technically excellent result with stasis of flow within the bilateral uterine vascular territories.  IMPRESSION: Successful bilateral uterine artery embolization (U F E).   Electronically Signed  By: Sandi Mariscal M.D.  On: 05/04/2015 10:59  Discharge Exam: Blood pressure 152/84, pulse 90, temperature 98.3 F (36.8 C), temperature source Oral, resp. rate 18, height 5' 4"  (1.626 m), weight 197 lb 8 oz (89.585 kg), last menstrual period 04/21/2015, SpO2 98 %.   Awake and Alert Abdomen soft, non-tender, non-distended. Lungs  CTA Heart RRR Groin stick ok, no hematoma, no pseudoaneurysm.  Disposition: She is ready for discharge home today.  She will follow up with Dr. Pascal Lux in the clinic in 2-4 weeks.  Rx: Zofran 8 mg PO q 8 prn nausea and Ibuprofen 600 mg PO q 6 hours with meals x 5 days.     Medication List    TAKE these medications        multivitamin tablet  Take 1 tablet by mouth daily.     VITAMIN C PO  Take by mouth daily.     VITAMIN D PO  Take by mouth daily.     VITAMIN E PO  Take by mouth daily.          Electronically Signed: Murrell Redden PA-C 05/05/2015, 1:48 PM   I have spent Less Than 30 Minutes discharging [place].

## 2015-05-05 NOTE — Care Management Note (Signed)
Case Management Note  Patient Details  Name: [name] MRN: [id number] Date of Birth: 06-26-1975  Subjective/Objective:   40 y/o f admitted w/fibroid. From home.                 Action/Plan:d/c plan home.   Expected Discharge Date:                  Expected Discharge Plan:  Home/Self Care  In-House Referral:     Discharge planning Services  CM Consult  Post Acute Care Choice:    Choice offered to:     DME Arranged:    DME Agency:     HH Arranged:    HH Agency:     Status of Service:  In process, will continue to follow  Medicare Important Message Given:    Date Medicare IM Given:    Medicare IM give by:    Date Additional Medicare IM Given:    Additional Medicare Important Message give by:     If discussed at [place] of Stay Meetings, dates discussed:    Additional Comments:  Dessa Phi, RN 05/05/2015, 10:36 AM

## 2015-05-05 NOTE — Progress Notes (Addendum)
Dilaudid PCA wasted in sink with Cristine Polio RN.  Wasted 8 cc in sink  I verify that Ann Lions RN wasted 8cc of Dilaudid PCA in the sink 05/05/15 Cristine Polio, RN

## 2015-05-05 NOTE — Care Management Note (Signed)
Case Management Note  Patient Details  Name: [name] MRN: [id number] Date of Birth: July 23, 1975  Subjective/Objective:                    Action/Plan:d/c home no needs or orders.   Expected Discharge Date:                  Expected Discharge Plan:  Home/Self Care  In-House Referral:     Discharge planning Services  CM Consult  Post Acute Care Choice:    Choice offered to:     DME Arranged:    DME Agency:     HH Arranged:    [place] Agency:     Status of Service:  Completed, signed off  Medicare Important Message Given:    Date Medicare IM Given:    Medicare IM give by:    Date Additional Medicare IM Given:    Additional Medicare Important Message give by:     If discussed at [place] of Stay Meetings, dates discussed:    Additional Comments:  Dessa Phi, RN 05/05/2015, 2:26 PM

## 2015-05-26 ENCOUNTER — Ambulatory Visit
Admission: RE | Admit: 2015-05-26 | Discharge: 2015-05-26 | Disposition: A | Discharge status: Home / Self Care | Payer: 59 | Source: Ambulatory Visit | Attending: Physician Assistant | Admitting: Physician Assistant

## 2015-05-26 DIAGNOSIS — D259 Leiomyoma of uterus, unspecified: Secondary | ICD-10-CM

## 2015-05-26 NOTE — Progress Notes (Signed)
Patient ID: [name], female   DOB: 10/18/1975, 40 y.o.   MRN: [id number]       Chief Complaint: Post uterine fibroid embolization  Referring Physician(s): Cousins  History of Present Illness: [name] is a 40 y.o. (G0, P0) female with past history significant for prior bilateral salpingectomy and myomectomy who returns to the interventional radiology clinic following technically successful bilateral uterine artery embolization performed 05/04/2015.  In brief, the patient was initially diagnosed with uterine fibroids in 2012. Her main fibroid related complaint surrounds marked menorrhagia including the passage of blood clots though she also admits bulk symptoms including urinary frequency and urgency.  For these symptoms, the patient underwent a successful uterine fibroid embolization on 05/04/2015. The patient was discharged the following day without incident.  The patient states that she has fully recovered from the procedure and is returned to work. Patient reports mild fleeting left lower abdominal/pelvic pain since returned to work though states this has resolved without dedicated intervention.  She is unclear if she has expressed a cycle since the procedure.  No fever or chills. No dysuria or hematuria. No change in bladder or bowel functions.   Past Medical History  Diagnosis Date  . Fibroids     Past Surgical History  Procedure Laterality Date  . Bilateral salpingectomy  2013    along with myomectomy  . Robot assisted myomectomy  2013    for uterine fibroids; along with salpingectomy    Allergies: Review of patient's allergies indicates no known allergies.  Medications: Prior to Admission medications   Medication Sig Start Date End Date Taking? Authorizing Provider  Ascorbic Acid (VITAMIN C PO) Take by mouth daily.    Historical Provider, MD  Cholecalciferol (VITAMIN D PO) Take by mouth daily.    Historical Provider, MD  Multiple Vitamin  (MULTIVITAMIN) tablet Take 1 tablet by mouth daily.    Historical Provider, MD  VITAMIN E PO Take by mouth daily.    Historical Provider, MD     No family history on file.  Social History   Social History  . Marital Status: Married    Spouse Name: N/A  . Number of Children: N/A  . Years of Education: N/A   Social History Main Topics  . Smoking status: Never Smoker   . Smokeless tobacco: Not on file  . Alcohol Use: 1.2 oz/week    2 Glasses of wine per week  . Drug Use: No  . Sexual Activity: Yes    Birth Control/ Protection: None   Other Topics Concern  . Not on file   Social History Narrative    ECOG Status: 0 - Asymptomatic  Review of Systems: A 12 point ROS discussed and pertinent positives are indicated in the HPI above.  All other systems are negative.  Review of Systems  Constitutional: Negative for fever, chills, activity change, appetite change and fatigue.  Respiratory: Negative.   Cardiovascular: Negative.   Genitourinary: Positive for pelvic pain.       Patient admits transient left lower quadrant abdominal/pelvic pain which resolves without dedicated intervention.    Vital Signs: BP 166/74 mmHg  Pulse 97  Temp(Src) 98.6 F (37 C) (Oral)  Resp 14  Ht 5' 4"  (1.626 m)  Wt 195 lb (88.451 kg)  BMI 33.46 kg/m2  SpO2 100%  LMP 04/21/2015 (Approximate)  Physical Exam  Constitutional: She appears well-developed and well-nourished.  Cardiovascular: Normal rate, regular rhythm and intact distal pulses.   Well healed right groin puncture  site.  Pulmonary/Chest: Effort normal and breath sounds normal.  Abdominal: Soft. Bowel sounds are normal.  Nontender to palpation of the lower abdomen.  Psychiatric: She has a normal mood and affect. Her behavior is normal.  Nursing note and vitals reviewed.    Imaging:  Selected images from the patient's preprocedural contrast enhanced pelvic MRI as well as angiographic images from bilateral uterine artery  embolization were reviewed in detail with the patient.   Ir Angiogram Pelvis Selective Or Supraselective  05/04/2015  INDICATION: Symptomatic uterine fibroids. Patient presents for bilateral uterine artery embolization EXAM: Uterine Fibroid Embolization MEDICATIONS: Versed 4 mg IV; Toradol 30 mg IV; Zofran 4 mg IV; Ancef 2 g IV; the antibiotic was administered within one hour of the procedure ANESTHESIA/SEDATION: Fentanyl 100 mcg IV; Versed 4 mg IV; Sedation time 47 minutes The patient was continuously monitored during the procedure by the interventional radiology nurse under my direct supervision. CONTRAST:  52 mL OMNIPAQUE IOHEXOL 300 MG/ML  SOLN FLUOROSCOPY TIME:  11 minutes 54 seconds (8,502 mGy) COMPLICATIONS: None immediate. PROCEDURE: Informed consent was obtained from the patient following explanation of the procedure, risks, benefits and alternatives. The patient understands, agrees and consents for the procedure. All questions were addressed. A time out was performed prior to the initiation of the procedure. Maximal barrier sterile technique utilized including caps, mask, sterile gowns, sterile gloves, large sterile drape, hand hygiene, and Betadine prep. The right femoral head was marked fluoroscopically. Under sterile conditions and local anesthesia, the right common femoral artery access was performed with a micropuncture needle. Under direct ultrasound guidance, the right common femoral was accessed with a micropuncture kit. An ultrasound image was saved for documentation purposes. This allowed for placement of a 5-French vascular sheath. A limited arteriogram was performed through the side arm of the sheath confirming appropriate access within the right common femoral artery. The 5-French C2 catheter was utilized to select the contralateral left internal iliac artery. Selective left internal iliac angiogram was performed. The tortuous left uterine artery was identified. Selective catheterization  was performed of the left uterine artery with a microcatheter and micro guide wire. A selective left uterine angiogram was performed. This demonstrated patency of the left uterine artery. Mild diffuse hypervascularity of the enlarged fibroid uterus. Access was adequate for embolization. For embolization, 1 vial of 500 - 700 micron Embospheres were injected into the left uterine artery. Post embolization angiogram confirms complete stasis of the left uterine vascular territory. Microcatheter was removed. The C2 catheter was retracted and utilized to select the right internal iliac artery. Selective right internal iliac angiogram was performed. The patent right uterine artery was identified. For selective catheterization, the micro catheter and guidewire were utilized to select the right uterine artery. Selective right uterine angiogram was performed. This demonstrated patency of the right uterine artery. Catheter position was safe for embolization. Embolization was performed to complete stasis with injection of 1 and 1/3 of vials of 500-700 micron Embospheres. Post embolization angiogram confirms complete stasis of the right uterine vascular territory. At this point, all wires, catheters and sheaths were removed from the patient. Hemostasis was achieved at the right groin access site with The patient tolerated the procedure well without immediate post procedural complication. FINDINGS: Bilateral internal iliac arteriograms demonstrates conventional configuration of the bilateral internal iliac arteries, both of which give rise to hypertrophied uterine arteries which supply a myomatous uterus. Note was made of a slight right sided uterine artery dominance. Completion arteriograms following bilateral uterine artery particle embolization  demonstrates a technically excellent result with stasis of flow within the bilateral uterine vascular territories. IMPRESSION: Successful bilateral uterine artery embolization (U F E).  Electronically Signed   By: Sandi Mariscal M.D.   On: 05/04/2015 10:59   Ir Angiogram Pelvis Selective Or Supraselective  05/04/2015  INDICATION: Symptomatic uterine fibroids. Patient presents for bilateral uterine artery embolization EXAM: Uterine Fibroid Embolization MEDICATIONS: Versed 4 mg IV; Toradol 30 mg IV; Zofran 4 mg IV; Ancef 2 g IV; the antibiotic was administered within one hour of the procedure ANESTHESIA/SEDATION: Fentanyl 100 mcg IV; Versed 4 mg IV; Sedation time 47 minutes The patient was continuously monitored during the procedure by the interventional radiology nurse under my direct supervision. CONTRAST:  52 mL OMNIPAQUE IOHEXOL 300 MG/ML  SOLN FLUOROSCOPY TIME:  11 minutes 54 seconds (1,191 mGy) COMPLICATIONS: None immediate. PROCEDURE: Informed consent was obtained from the patient following explanation of the procedure, risks, benefits and alternatives. The patient understands, agrees and consents for the procedure. All questions were addressed. A time out was performed prior to the initiation of the procedure. Maximal barrier sterile technique utilized including caps, mask, sterile gowns, sterile gloves, large sterile drape, hand hygiene, and Betadine prep. The right femoral head was marked fluoroscopically. Under sterile conditions and local anesthesia, the right common femoral artery access was performed with a micropuncture needle. Under direct ultrasound guidance, the right common femoral was accessed with a micropuncture kit. An ultrasound image was saved for documentation purposes. This allowed for placement of a 5-French vascular sheath. A limited arteriogram was performed through the side arm of the sheath confirming appropriate access within the right common femoral artery. The 5-French C2 catheter was utilized to select the contralateral left internal iliac artery. Selective left internal iliac angiogram was performed. The tortuous left uterine artery was identified. Selective  catheterization was performed of the left uterine artery with a microcatheter and micro guide wire. A selective left uterine angiogram was performed. This demonstrated patency of the left uterine artery. Mild diffuse hypervascularity of the enlarged fibroid uterus. Access was adequate for embolization. For embolization, 1 vial of 500 - 700 micron Embospheres were injected into the left uterine artery. Post embolization angiogram confirms complete stasis of the left uterine vascular territory. Microcatheter was removed. The C2 catheter was retracted and utilized to select the right internal iliac artery. Selective right internal iliac angiogram was performed. The patent right uterine artery was identified. For selective catheterization, the micro catheter and guidewire were utilized to select the right uterine artery. Selective right uterine angiogram was performed. This demonstrated patency of the right uterine artery. Catheter position was safe for embolization. Embolization was performed to complete stasis with injection of 1 and 1/3 of vials of 500-700 micron Embospheres. Post embolization angiogram confirms complete stasis of the right uterine vascular territory. At this point, all wires, catheters and sheaths were removed from the patient. Hemostasis was achieved at the right groin access site with The patient tolerated the procedure well without immediate post procedural complication. FINDINGS: Bilateral internal iliac arteriograms demonstrates conventional configuration of the bilateral internal iliac arteries, both of which give rise to hypertrophied uterine arteries which supply a myomatous uterus. Note was made of a slight right sided uterine artery dominance. Completion arteriograms following bilateral uterine artery particle embolization demonstrates a technically excellent result with stasis of flow within the bilateral uterine vascular territories. IMPRESSION: Successful bilateral uterine artery  embolization (U [facility] E). Electronically Signed   By: Sandi Mariscal M.D.   On:  05/04/2015 10:59   Ir Angiogram Selective Each Additional Vessel  05/04/2015  INDICATION: Symptomatic uterine fibroids. Patient presents for bilateral uterine artery embolization EXAM: Uterine Fibroid Embolization MEDICATIONS: Versed 4 mg IV; Toradol 30 mg IV; Zofran 4 mg IV; Ancef 2 g IV; the antibiotic was administered within one hour of the procedure ANESTHESIA/SEDATION: Fentanyl 100 mcg IV; Versed 4 mg IV; Sedation time 47 minutes The patient was continuously monitored during the procedure by the interventional radiology nurse under my direct supervision. CONTRAST:  52 mL OMNIPAQUE IOHEXOL 300 MG/ML  SOLN FLUOROSCOPY TIME:  11 minutes 54 seconds (0,272 mGy) COMPLICATIONS: None immediate. PROCEDURE: Informed consent was obtained from the patient following explanation of the procedure, risks, benefits and alternatives. The patient understands, agrees and consents for the procedure. All questions were addressed. A time out was performed prior to the initiation of the procedure. Maximal barrier sterile technique utilized including caps, mask, sterile gowns, sterile gloves, large sterile drape, hand hygiene, and Betadine prep. The right femoral head was marked fluoroscopically. Under sterile conditions and local anesthesia, the right common femoral artery access was performed with a micropuncture needle. Under direct ultrasound guidance, the right common femoral was accessed with a micropuncture kit. An ultrasound image was saved for documentation purposes. This allowed for placement of a 5-French vascular sheath. A limited arteriogram was performed through the side arm of the sheath confirming appropriate access within the right common femoral artery. The 5-French C2 catheter was utilized to select the contralateral left internal iliac artery. Selective left internal iliac angiogram was performed. The tortuous left uterine artery was  identified. Selective catheterization was performed of the left uterine artery with a microcatheter and micro guide wire. A selective left uterine angiogram was performed. This demonstrated patency of the left uterine artery. Mild diffuse hypervascularity of the enlarged fibroid uterus. Access was adequate for embolization. For embolization, 1 vial of 500 - 700 micron Embospheres were injected into the left uterine artery. Post embolization angiogram confirms complete stasis of the left uterine vascular territory. Microcatheter was removed. The C2 catheter was retracted and utilized to select the right internal iliac artery. Selective right internal iliac angiogram was performed. The patent right uterine artery was identified. For selective catheterization, the micro catheter and guidewire were utilized to select the right uterine artery. Selective right uterine angiogram was performed. This demonstrated patency of the right uterine artery. Catheter position was safe for embolization. Embolization was performed to complete stasis with injection of 1 and 1/3 of vials of 500-700 micron Embospheres. Post embolization angiogram confirms complete stasis of the right uterine vascular territory. At this point, all wires, catheters and sheaths were removed from the patient. Hemostasis was achieved at the right groin access site with The patient tolerated the procedure well without immediate post procedural complication. FINDINGS: Bilateral internal iliac arteriograms demonstrates conventional configuration of the bilateral internal iliac arteries, both of which give rise to hypertrophied uterine arteries which supply a myomatous uterus. Note was made of a slight right sided uterine artery dominance. Completion arteriograms following bilateral uterine artery particle embolization demonstrates a technically excellent result with stasis of flow within the bilateral uterine vascular territories. IMPRESSION: Successful bilateral  uterine artery embolization (U F E). Electronically Signed   By: Sandi Mariscal M.D.   On: 05/04/2015 10:59   Ir Angiogram Selective Each Additional Vessel  05/04/2015  INDICATION: Symptomatic uterine fibroids. Patient presents for bilateral uterine artery embolization EXAM: Uterine Fibroid Embolization MEDICATIONS: Versed 4 mg IV; Toradol 30 mg IV;  Zofran 4 mg IV; Ancef 2 g IV; the antibiotic was administered within one hour of the procedure ANESTHESIA/SEDATION: Fentanyl 100 mcg IV; Versed 4 mg IV; Sedation time 47 minutes The patient was continuously monitored during the procedure by the interventional radiology nurse under my direct supervision. CONTRAST:  52 mL OMNIPAQUE IOHEXOL 300 MG/ML  SOLN FLUOROSCOPY TIME:  11 minutes 54 seconds (9,518 mGy) COMPLICATIONS: None immediate. PROCEDURE: Informed consent was obtained from the patient following explanation of the procedure, risks, benefits and alternatives. The patient understands, agrees and consents for the procedure. All questions were addressed. A time out was performed prior to the initiation of the procedure. Maximal barrier sterile technique utilized including caps, mask, sterile gowns, sterile gloves, large sterile drape, hand hygiene, and Betadine prep. The right femoral head was marked fluoroscopically. Under sterile conditions and local anesthesia, the right common femoral artery access was performed with a micropuncture needle. Under direct ultrasound guidance, the right common femoral was accessed with a micropuncture kit. An ultrasound image was saved for documentation purposes. This allowed for placement of a 5-French vascular sheath. A limited arteriogram was performed through the side arm of the sheath confirming appropriate access within the right common femoral artery. The 5-French C2 catheter was utilized to select the contralateral left internal iliac artery. Selective left internal iliac angiogram was performed. The tortuous left uterine  artery was identified. Selective catheterization was performed of the left uterine artery with a microcatheter and micro guide wire. A selective left uterine angiogram was performed. This demonstrated patency of the left uterine artery. Mild diffuse hypervascularity of the enlarged fibroid uterus. Access was adequate for embolization. For embolization, 1 vial of 500 - 700 micron Embospheres were injected into the left uterine artery. Post embolization angiogram confirms complete stasis of the left uterine vascular territory. Microcatheter was removed. The C2 catheter was retracted and utilized to select the right internal iliac artery. Selective right internal iliac angiogram was performed. The patent right uterine artery was identified. For selective catheterization, the micro catheter and guidewire were utilized to select the right uterine artery. Selective right uterine angiogram was performed. This demonstrated patency of the right uterine artery. Catheter position was safe for embolization. Embolization was performed to complete stasis with injection of 1 and 1/3 of vials of 500-700 micron Embospheres. Post embolization angiogram confirms complete stasis of the right uterine vascular territory. At this point, all wires, catheters and sheaths were removed from the patient. Hemostasis was achieved at the right groin access site with The patient tolerated the procedure well without immediate post procedural complication. FINDINGS: Bilateral internal iliac arteriograms demonstrates conventional configuration of the bilateral internal iliac arteries, both of which give rise to hypertrophied uterine arteries which supply a myomatous uterus. Note was made of a slight right sided uterine artery dominance. Completion arteriograms following bilateral uterine artery particle embolization demonstrates a technically excellent result with stasis of flow within the bilateral uterine vascular territories. IMPRESSION:  Successful bilateral uterine artery embolization (U [facility] E). Electronically Signed   By: Sandi Mariscal M.D.   On: 05/04/2015 10:59   Ir US Guide Vasc Access Right  05/04/2015  INDICATION: Symptomatic uterine fibroids. Patient presents for bilateral uterine artery embolization EXAM: Uterine Fibroid Embolization MEDICATIONS: Versed 4 mg IV; Toradol 30 mg IV; Zofran 4 mg IV; Ancef 2 g IV; the antibiotic was administered within one hour of the procedure ANESTHESIA/SEDATION: Fentanyl 100 mcg IV; Versed 4 mg IV; Sedation time 47 minutes The patient was continuously monitored during  the procedure by the interventional radiology nurse under my direct supervision. CONTRAST:  52 mL OMNIPAQUE IOHEXOL 300 MG/ML  SOLN FLUOROSCOPY TIME:  11 minutes 54 seconds (0,973 mGy) COMPLICATIONS: None immediate. PROCEDURE: Informed consent was obtained from the patient following explanation of the procedure, risks, benefits and alternatives. The patient understands, agrees and consents for the procedure. All questions were addressed. A time out was performed prior to the initiation of the procedure. Maximal barrier sterile technique utilized including caps, mask, sterile gowns, sterile gloves, large sterile drape, hand hygiene, and Betadine prep. The right femoral head was marked fluoroscopically. Under sterile conditions and local anesthesia, the right common femoral artery access was performed with a micropuncture needle. Under direct ultrasound guidance, the right common femoral was accessed with a micropuncture kit. An ultrasound image was saved for documentation purposes. This allowed for placement of a 5-French vascular sheath. A limited arteriogram was performed through the side arm of the sheath confirming appropriate access within the right common femoral artery. The 5-French C2 catheter was utilized to select the contralateral left internal iliac artery. Selective left internal iliac angiogram was performed. The tortuous left  uterine artery was identified. Selective catheterization was performed of the left uterine artery with a microcatheter and micro guide wire. A selective left uterine angiogram was performed. This demonstrated patency of the left uterine artery. Mild diffuse hypervascularity of the enlarged fibroid uterus. Access was adequate for embolization. For embolization, 1 vial of 500 - 700 micron Embospheres were injected into the left uterine artery. Post embolization angiogram confirms complete stasis of the left uterine vascular territory. Microcatheter was removed. The C2 catheter was retracted and utilized to select the right internal iliac artery. Selective right internal iliac angiogram was performed. The patent right uterine artery was identified. For selective catheterization, the micro catheter and guidewire were utilized to select the right uterine artery. Selective right uterine angiogram was performed. This demonstrated patency of the right uterine artery. Catheter position was safe for embolization. Embolization was performed to complete stasis with injection of 1 and 1/3 of vials of 500-700 micron Embospheres. Post embolization angiogram confirms complete stasis of the right uterine vascular territory. At this point, all wires, catheters and sheaths were removed from the patient. Hemostasis was achieved at the right groin access site with The patient tolerated the procedure well without immediate post procedural complication. FINDINGS: Bilateral internal iliac arteriograms demonstrates conventional configuration of the bilateral internal iliac arteries, both of which give rise to hypertrophied uterine arteries which supply a myomatous uterus. Note was made of a slight right sided uterine artery dominance. Completion arteriograms following bilateral uterine artery particle embolization demonstrates a technically excellent result with stasis of flow within the bilateral uterine vascular territories. IMPRESSION:  Successful bilateral uterine artery embolization (U [facility] E). Electronically Signed   By: Sandi Mariscal M.D.   On: 05/04/2015 10:59   Ir Embo Tumor Organ Ischemia Infarct Inc Guide Roadmapping  05/04/2015  INDICATION: Symptomatic uterine fibroids. Patient presents for bilateral uterine artery embolization EXAM: Uterine Fibroid Embolization MEDICATIONS: Versed 4 mg IV; Toradol 30 mg IV; Zofran 4 mg IV; Ancef 2 g IV; the antibiotic was administered within one hour of the procedure ANESTHESIA/SEDATION: Fentanyl 100 mcg IV; Versed 4 mg IV; Sedation time 47 minutes The patient was continuously monitored during the procedure by the interventional radiology nurse under my direct supervision. CONTRAST:  52 mL OMNIPAQUE IOHEXOL 300 MG/ML  SOLN FLUOROSCOPY TIME:  11 minutes 54 seconds (5,329 mGy) COMPLICATIONS: None immediate. PROCEDURE:  Informed consent was obtained from the patient following explanation of the procedure, risks, benefits and alternatives. The patient understands, agrees and consents for the procedure. All questions were addressed. A time out was performed prior to the initiation of the procedure. Maximal barrier sterile technique utilized including caps, mask, sterile gowns, sterile gloves, large sterile drape, hand hygiene, and Betadine prep. The right femoral head was marked fluoroscopically. Under sterile conditions and local anesthesia, the right common femoral artery access was performed with a micropuncture needle. Under direct ultrasound guidance, the right common femoral was accessed with a micropuncture kit. An ultrasound image was saved for documentation purposes. This allowed for placement of a 5-French vascular sheath. A limited arteriogram was performed through the side arm of the sheath confirming appropriate access within the right common femoral artery. The 5-French C2 catheter was utilized to select the contralateral left internal iliac artery. Selective left internal iliac angiogram was  performed. The tortuous left uterine artery was identified. Selective catheterization was performed of the left uterine artery with a microcatheter and micro guide wire. A selective left uterine angiogram was performed. This demonstrated patency of the left uterine artery. Mild diffuse hypervascularity of the enlarged fibroid uterus. Access was adequate for embolization. For embolization, 1 vial of 500 - 700 micron Embospheres were injected into the left uterine artery. Post embolization angiogram confirms complete stasis of the left uterine vascular territory. Microcatheter was removed. The C2 catheter was retracted and utilized to select the right internal iliac artery. Selective right internal iliac angiogram was performed. The patent right uterine artery was identified. For selective catheterization, the micro catheter and guidewire were utilized to select the right uterine artery. Selective right uterine angiogram was performed. This demonstrated patency of the right uterine artery. Catheter position was safe for embolization. Embolization was performed to complete stasis with injection of 1 and 1/3 of vials of 500-700 micron Embospheres. Post embolization angiogram confirms complete stasis of the right uterine vascular territory. At this point, all wires, catheters and sheaths were removed from the patient. Hemostasis was achieved at the right groin access site with The patient tolerated the procedure well without immediate post procedural complication. FINDINGS: Bilateral internal iliac arteriograms demonstrates conventional configuration of the bilateral internal iliac arteries, both of which give rise to hypertrophied uterine arteries which supply a myomatous uterus. Note was made of a slight right sided uterine artery dominance. Completion arteriograms following bilateral uterine artery particle embolization demonstrates a technically excellent result with stasis of flow within the bilateral uterine  vascular territories. IMPRESSION: Successful bilateral uterine artery embolization (U [facility] E). Electronically Signed   By: Sandi Mariscal M.D.   On: 05/04/2015 10:59    Labs:  CBC:  Recent Labs  05/04/15 0750  WBC 6.4  HGB 10.3*  HCT 33.1*  PLT 392    COAGS:  Recent Labs  05/04/15 0750  INR 1.06  APTT 30    BMP:  Recent Labs  05/04/15 0750  NA 141  K 3.6  CL 108  CO2 24  GLUCOSE 97  BUN 9  CALCIUM 9.3  CREATININE 0.85  GFRNONAA >60  GFRAA >60    Assessment and Plan:  [name] is a 40 y.o. (G0, P0) female with past history significant for prior bilateral salpingectomy and myomectomy who returns to the interventional radiology clinic following technically successful bilateral uterine artery embolization performed 05/04/2015.  The patient has fully recovered from the bilateral uterine artery embolization. She admits to mild transient lower abdominal/pelvic discomfort  though this resolves without dedicated intervention. The patient is otherwise fully recovered from the bilateral uterine artery embolization.  Selected images from the patient's contrast enhanced pelvic MRI as well as intra-procedural angiographic images from the bilateral uterine artery embolization reviewed in detail with the patient.  The patient will be called in approximately 3 months to ensure continued improvement in her fibroid related symptoms. Assuming continued improvement and recovery, the patient will be seen in follow-up consultation approximately 6 months following the procedure (June/July 2017). A contrast enhanced pelvic MRI will only be obtained if the patient were to experience persistent or recurrent symptoms.  The patient was encouraged to call the interventional radiology clinic with any interval questions or concerns.  A copy of this report was sent to the requesting provider on this date.  Electronically Signed: Sandi Mariscal 05/26/2015, 9:49 AM   I spent a total of 15  Minutes in face to face in clinical consultation, greater than 50% of which was counseling/coordinating care for post uterine fibroid embolization

## 2015-07-28 ENCOUNTER — Telehealth: Payer: Self-pay | Admitting: Radiology

## 2015-07-28 NOTE — Telephone Encounter (Signed)
Patient doing well 3 mo s/p [place].    LMP:  07/19/2015.  Patient reports that Sx have greatly improved.  Decrease flow & improvement in urinary frequency.  Prefers to schedule follow up appointment with Dr Pascal Lux at 6 mo s/p [place].  Jamell Laymon Riki Rusk, RN 07/28/2015 3:40 PM

## 2015-08-09 ENCOUNTER — Other Ambulatory Visit: Payer: Self-pay

## 2015-08-09 DIAGNOSIS — Z1231 Encounter for screening mammogram for malignant neoplasm of breast: Secondary | ICD-10-CM

## 2015-08-16 ENCOUNTER — Ambulatory Visit
Admission: RE | Admit: 2015-08-16 | Discharge: 2015-08-16 | Disposition: A | Discharge status: Home / Self Care | Payer: 59 | Source: Ambulatory Visit

## 2015-08-16 DIAGNOSIS — Z1231 Encounter for screening mammogram for malignant neoplasm of breast: Secondary | ICD-10-CM

## 2015-09-16 ENCOUNTER — Other Ambulatory Visit (HOSPITAL_COMMUNITY): Payer: Self-pay | Admitting: Physician Assistant

## 2015-09-16 ENCOUNTER — Other Ambulatory Visit (HOSPITAL_COMMUNITY): Payer: Self-pay | Admitting: Interventional Radiology

## 2015-09-16 DIAGNOSIS — D259 Leiomyoma of uterus, unspecified: Secondary | ICD-10-CM

## 2015-10-26 ENCOUNTER — Other Ambulatory Visit: Payer: 59

## 2015-11-18 ENCOUNTER — Ambulatory Visit
Admission: RE | Admit: 2015-11-18 | Discharge: 2015-11-18 | Disposition: A | Discharge status: Home / Self Care | Payer: 59 | Source: Ambulatory Visit | Attending: Interventional Radiology | Admitting: Interventional Radiology

## 2015-11-18 DIAGNOSIS — D259 Leiomyoma of uterus, unspecified: Secondary | ICD-10-CM

## 2015-11-18 HISTORY — PX: IR GENERIC HISTORICAL: IMG1180011

## 2015-11-18 NOTE — Progress Notes (Signed)
Chief Complaint: Patient was seen in consultation today for 6 month follow up of uterine artery embolization.  Referring Physician(s): Dr. Servando Salina  Supervising Physician: Sandi Mariscal  History of Present Illness: [name] is a 40 y.o. female who underwent successful Uterine artery embolization on 05/04/2015. She has done very well. Had expected cramping discomfort the first few weeks, but this has subsided and resolved. She denies any issues with her femoral arterial access site. Her menses are now regular, and without pain or heavy bleeding. She feels great and is pleased with her outcome. No imaging has been done in the interval. PMHx, meds reviewed and updated.  Past Medical History:  Diagnosis Date  . Fibroids     Past Surgical History:  Procedure Laterality Date  . BILATERAL SALPINGECTOMY  2013   along with myomectomy  . ROBOT ASSISTED MYOMECTOMY  2013   for uterine fibroids; along with salpingectomy    Allergies: Review of patient's allergies indicates no known allergies.  Medications: Prior to Admission medications   Medication Sig Start Date End Date Taking? Authorizing Provider  Ascorbic Acid (VITAMIN C PO) Take by mouth daily.    Historical Provider, MD  Cholecalciferol (VITAMIN D PO) Take by mouth daily.    Historical Provider, MD  Multiple Vitamin (MULTIVITAMIN) tablet Take 1 tablet by mouth daily.    Historical Provider, MD  VITAMIN E PO Take by mouth daily.    Historical Provider, MD     No family history on file.  Social History   Social History  . Marital status: Married    Spouse name: N/A  . Number of children: N/A  . Years of education: N/A   Social History Main Topics  . Smoking status: Never Smoker  . Smokeless tobacco: None  . Alcohol use 1.2 oz/week    2 Glasses of wine per week  . Drug use: No  . Sexual activity: Yes    Birth control/ protection: None   Other Topics Concern  . None   Social History  Narrative  . None    Review of Systems: A 12 point ROS discussed and pertinent positives are indicated in the HPI above.  All other systems are negative.  Review of Systems  Vital Signs: BP 139/74 (BP Location: Right Arm, Patient Position: Sitting, Cuff Size: Normal)   Pulse 92   Temp 98.4 F (36.9 C)   Resp 14   Ht 5\' 4"  (1.626 m)   Wt 195 lb (88.5 kg)   LMP 11/08/2015 (Exact Date)   SpO2 100%   BMI 33.47 kg/m   Physical Exam  Constitutional: She appears well-developed and well-nourished. No distress.  Cardiovascular: Intact distal pulses.   Abdominal: Soft. She exhibits no distension and no mass. There is no tenderness.    Imaging: No results found.  Labs:  CBC:  Recent Labs  05/04/15 0750  WBC 6.4  HGB 10.3*  HCT 33.1*  PLT 392    COAGS:  Recent Labs  05/04/15 0750  INR 1.06  APTT 30    BMP:  Recent Labs  05/04/15 0750  NA 141  K 3.6  CL 108  CO2 24  GLUCOSE 97  BUN 9  CALCIUM 9.3  CREATININE 0.85  GFRNONAA >60  GFRAA >60    Assessment: S/p successful uterine artery/fibroid embolization on 05/04/15   Electronically Signed: Ascencion Dike 11/18/2015, 2:12 PM   Please refer to Dr. Pascal Lux attestation of this note for management and plan.

## 2016-04-13 ENCOUNTER — Encounter: Payer: Self-pay | Admitting: Interventional Radiology

## 2016-08-28 ENCOUNTER — Other Ambulatory Visit: Payer: Self-pay | Admitting: Internal Medicine

## 2016-08-28 DIAGNOSIS — Z1231 Encounter for screening mammogram for malignant neoplasm of breast: Secondary | ICD-10-CM

## 2016-09-13 ENCOUNTER — Ambulatory Visit
Admission: RE | Admit: 2016-09-13 | Discharge: 2016-09-13 | Disposition: A | Discharge status: Home / Self Care | Payer: 59 | Source: Ambulatory Visit | Attending: Internal Medicine | Admitting: Internal Medicine

## 2016-09-13 DIAGNOSIS — Z1231 Encounter for screening mammogram for malignant neoplasm of breast: Secondary | ICD-10-CM

## 2017-08-13 ENCOUNTER — Other Ambulatory Visit: Payer: Self-pay | Admitting: Internal Medicine

## 2017-08-13 DIAGNOSIS — Z1231 Encounter for screening mammogram for malignant neoplasm of breast: Secondary | ICD-10-CM

## 2017-09-17 ENCOUNTER — Ambulatory Visit
Admission: RE | Admit: 2017-09-17 | Discharge: 2017-09-17 | Disposition: A | Discharge status: Home / Self Care | Payer: 59 | Source: Ambulatory Visit | Attending: Internal Medicine | Admitting: Internal Medicine

## 2017-09-17 DIAGNOSIS — Z1231 Encounter for screening mammogram for malignant neoplasm of breast: Secondary | ICD-10-CM

## 2017-09-21 ENCOUNTER — Other Ambulatory Visit: Payer: Self-pay

## 2017-09-21 ENCOUNTER — Emergency Department (HOSPITAL_BASED_OUTPATIENT_CLINIC_OR_DEPARTMENT_OTHER)
Admission: EM | Admit: 2017-09-21 | Discharge: 2017-09-21 | Disposition: A | Discharge status: Home / Self Care | Payer: 59 | Attending: Emergency Medicine | Admitting: Emergency Medicine

## 2017-09-21 ENCOUNTER — Encounter (HOSPITAL_BASED_OUTPATIENT_CLINIC_OR_DEPARTMENT_OTHER): Payer: Self-pay | Admitting: Emergency Medicine

## 2017-09-21 DIAGNOSIS — Z79899 Other long term (current) drug therapy: Secondary | ICD-10-CM | POA: Insufficient documentation

## 2017-09-21 DIAGNOSIS — S199XXA Unspecified injury of neck, initial encounter: Secondary | ICD-10-CM | POA: Diagnosis present

## 2017-09-21 DIAGNOSIS — Y939 Activity, unspecified: Secondary | ICD-10-CM | POA: Insufficient documentation

## 2017-09-21 DIAGNOSIS — Y929 Unspecified place or not applicable: Secondary | ICD-10-CM | POA: Diagnosis not present

## 2017-09-21 DIAGNOSIS — Y999 Unspecified external cause status: Secondary | ICD-10-CM | POA: Diagnosis not present

## 2017-09-21 DIAGNOSIS — S161XXA Strain of muscle, fascia and tendon at neck level, initial encounter: Secondary | ICD-10-CM | POA: Diagnosis not present

## 2017-09-21 NOTE — Discharge Instructions (Signed)
You can use heating pad, hot showers or muscle rubs to help with the aching muscles.  Consider therapeutic massage in 1 week if not better.  You can use ibuprofen 2 tablets every 6 hours as needed for aches and pains or Aleve twice a day.
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
## 2017-09-21 NOTE — ED Notes (Signed)
Pt out at desk, says she is waiting on her "note." Pt made aware that the dr still has not finished her discharge paperwork, the nurse will bring it to her when it is complete; asked to wait in treatment room for that.
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
## 2018-02-20 ENCOUNTER — Ambulatory Visit (INDEPENDENT_AMBULATORY_CARE_PROVIDER_SITE_OTHER): Payer: 59 | Admitting: Internal Medicine

## 2018-02-20 ENCOUNTER — Encounter: Payer: Self-pay | Admitting: Internal Medicine

## 2018-02-20 VITALS — BP 126/80 | HR 96 | Temp 98.6°F | Ht 63.0 in | Wt 214.2 lb

## 2018-02-20 DIAGNOSIS — Z0000 Encounter for general adult medical examination without abnormal findings: Secondary | ICD-10-CM | POA: Diagnosis not present

## 2018-02-20 LAB — POCT URINALYSIS DIPSTICK
Bilirubin, UA: NEGATIVE
Blood, UA: NEGATIVE
Glucose, UA: NEGATIVE
Ketones, UA: NEGATIVE
Leukocytes, UA: NEGATIVE
NITRITE UA: NEGATIVE
PROTEIN UA: NEGATIVE
Spec Grav, UA: 1.02 (ref 1.010–1.025)
Urobilinogen, UA: 0.2 E.U./dL
pH, UA: 5.5 (ref 5.0–8.0)

## 2018-02-20 NOTE — Progress Notes (Signed)
Subjective:     Patient ID: [name] , female    DOB: [birth year] , 42 y.o.   MRN: [id number]   Chief Complaint  Patient presents with  . Annual Exam    HPI  SHE IS HERE TODAY FOR A FULL PHYSICAL EXAM.  SHE IS FOLLOWED BY GYN FOR HER PAP SMEARS. SHE HAS NO SPECIFIC CONCERNS OR COMPLAINTS AT THIS TIME.     Past Medical History:  Diagnosis Date  . Fibroids      Family History  Problem Relation Age of Onset  . Hypertension Mother   . Hyperthyroidism Mother   . Prostate cancer Maternal Grandfather      Current Outpatient Medications:  .  Ascorbic Acid (VITAMIN C PO), Take by mouth daily., Disp: , Rfl:  .  Cholecalciferol (VITAMIN D PO), Take by mouth daily., Disp: , Rfl:  .  Multiple Vitamin (MULTIVITAMIN) tablet, Take 1 tablet by mouth daily., Disp: , Rfl:  .  VITAMIN E PO, Take by mouth daily., Disp: , Rfl:    No Known Allergies   Diet regular.The patient states her exercise level is  OCCASIONAL.   Marland Kitchen The patient's tobacco use is:  Social History   Tobacco Use  Smoking Status Never Smoker  Smokeless Tobacco Never Used  . She has been exposed to passive smoke. The patient's alcohol use is:  Social History   Substance and Sexual Activity  Alcohol Use Yes  . Alcohol/week: 2.0 standard drinks  . Types: 2 Glasses of wine per week  . Additional information: Last pap Summer 2019, next one scheduled for 2020.   Review of Systems  Constitutional: Negative.   HENT: Negative.   Eyes: Negative.   Respiratory: Negative.   Cardiovascular: Negative.   Gastrointestinal: Negative.   Endocrine: Negative.   Genitourinary: Negative.   Musculoskeletal: Negative.   Skin: Negative.   Allergic/Immunologic: Negative.   Neurological: Negative.   Hematological: Negative.   Psychiatric/Behavioral: Negative.      Today's Vitals   02/20/18 1045  BP: 126/80  Pulse: 96  Temp: 98.6 F (37 C)  TempSrc: Oral  Weight: 214 lb 3.2 oz (97.2 kg)  Height: 5' 3"  (1.6 m)   Body  mass index is 37.94 kg/m.   Objective:  Physical Exam  Constitutional: She is oriented to person, place, and time. She appears well-developed and well-nourished.  HENT:  Head: Normocephalic and atraumatic.  Right Ear: External ear normal.  Left Ear: External ear normal.  Nose: Nose normal.  Mouth/Throat: Oropharynx is clear and moist.  Eyes: Pupils are equal, round, and reactive to light. Conjunctivae and EOM are normal.  Neck: Normal range of motion. Neck supple.  Cardiovascular: Normal rate, regular rhythm, normal heart sounds and intact distal pulses.  Pulmonary/Chest: Effort normal and breath sounds normal. Right breast exhibits no inverted nipple, no mass, no nipple discharge, no skin change and no tenderness. Left breast exhibits no inverted nipple, no mass, no nipple discharge, no skin change and no tenderness.  Abdominal: Soft. Bowel sounds are normal.  Genitourinary:  Genitourinary Comments: DEFERRED  Musculoskeletal: Normal range of motion.  Neurological: She is alert and oriented to person, place, and time.  Skin: Skin is warm and dry.  Psychiatric: She has a normal mood and affect.  Nursing note and vitals reviewed.       Assessment And Plan:     1. Routine general medical examination at health care facility  A FULL EXAM WAS PERFORMED.  IMPORTANCE OF MONTHLY SELF BREAST  EXAMS WAS DISCUSSED WITH THE PATIENT.  PATIENT HAS BEEN ADVISED TO GET 30-45 MINUTES REGULAR EXERCISE NO LESS THAN FOUR TO FIVE DAYS PER WEEK - BOTH WEIGHTBEARING EXERCISES AND AEROBIC ARE RECOMMENDED.  SHE IS ADVISED TO FOLLOW A HEALTHY DIET WITH AT LEAST SIX FRUITS/VEGGIES PER DAY, DECREASE INTAKE OF RED MEAT, AND TO INCREASE FISH INTAKE TO TWO DAYS PER WEEK.  MEATS/FISH SHOULD NOT BE FRIED, BAKED OR BROILED IS PREFERABLE.  I SUGGEST WEARING SPF 50 SUNSCREEN ON EXPOSED PARTS AND ESPECIALLY WHEN IN THE DIRECT SUNLIGHT FOR AN EXTENDED PERIOD OF TIME.  PLEASE AVOID FAST FOOD RESTAURANTS AND INCREASE YOUR  WATER INTAKE.  - HIV antibody (with reflex) - CMP14+EGFR - CBC - Lipid panel - Hemoglobin A1c        [name], MD

## 2018-02-21 LAB — LIPID PANEL
Chol/HDL Ratio: 4.2 ratio (ref 0.0–4.4)
Cholesterol, Total: 173 mg/dL (ref 100–199)
HDL: 41 mg/dL (ref >39)
LDL CALC: 111 mg/dL — AB (ref 0–99)
TRIGLYCERIDES: 106 mg/dL (ref 0–149)
VLDL Cholesterol Cal: 21 mg/dL (ref 5–40)

## 2018-02-21 LAB — CMP14+EGFR
ALT: 18 IU/L (ref 0–32)
AST: 17 IU/L (ref 0–40)
Albumin/Globulin Ratio: 1.4 (ref 1.2–2.2)
Albumin: 4.6 g/dL (ref 3.5–5.5)
Alkaline Phosphatase: 91 IU/L (ref 39–117)
BUN/Creatinine Ratio: 12 (ref 9–23)
BUN: 11 mg/dL (ref 6–24)
Bilirubin Total: 0.2 mg/dL (ref 0.0–1.2)
CALCIUM: 9.5 mg/dL (ref 8.7–10.2)
CO2: 21 mmol/L (ref 20–29)
CREATININE: 0.92 mg/dL (ref 0.57–1.00)
Chloride: 107 mmol/L — ABNORMAL HIGH (ref 96–106)
GFR calc Af Amer: 89 mL/min/{1.73_m2} (ref >59)
GFR, EST NON AFRICAN AMERICAN: 77 mL/min/{1.73_m2} (ref >59)
GLOBULIN, TOTAL: 3.2 g/dL (ref 1.5–4.5)
Glucose: 93 mg/dL (ref 65–99)
Potassium: 4.7 mmol/L (ref 3.5–5.2)
Sodium: 143 mmol/L (ref 134–144)
Total Protein: 7.8 g/dL (ref 6.0–8.5)

## 2018-02-21 LAB — CBC
HEMATOCRIT: 32.1 % — AB (ref 34.0–46.6)
HEMOGLOBIN: 10 g/dL — AB (ref 11.1–15.9)
MCH: 21.8 pg — AB (ref 26.6–33.0)
MCHC: 31.2 g/dL — ABNORMAL LOW (ref 31.5–35.7)
MCV: 70 fL — AB (ref 79–97)
Platelets: 407 10*3/uL (ref 150–450)
RBC: 4.58 x10E6/uL (ref 3.77–5.28)
RDW: 15.8 % — ABNORMAL HIGH (ref 12.3–15.4)
WBC: 6.9 10*3/uL (ref 3.4–10.8)

## 2018-02-21 LAB — HEMOGLOBIN A1C
ESTIMATED AVERAGE GLUCOSE: 123 mg/dL
Hgb A1c MFr Bld: 5.9 % — ABNORMAL HIGH (ref 4.8–5.6)

## 2018-02-21 LAB — HIV ANTIBODY (ROUTINE TESTING W REFLEX): HIV SCREEN 4TH GENERATION: NONREACTIVE

## 2018-02-21 NOTE — Progress Notes (Signed)
Hello,   Here are your lab results:  You are HIV negative. Your liver and kidney function are normal. You are anemic. I will add on some iron levels. Your LDL, bad cholesterol is 111 - should be less than 100. Lastly, you are not prediabetic.   I will let you know when the other labs are back. I pray you have a stress-free 2020!!!! Happy holidays to you and your family!  Sincerely,    Joh Rao N. Baird Cancer, MD

## 2018-02-24 ENCOUNTER — Encounter: Payer: Self-pay | Admitting: Internal Medicine

## 2018-03-16 LAB — IRON AND TIBC
IRON SATURATION: 6 % — AB (ref 15–55)
IRON: 28 ug/dL (ref 27–159)
TIBC: 449 ug/dL (ref 250–450)
UIBC: 421 ug/dL (ref 131–425)

## 2018-03-16 LAB — FERRITIN: Ferritin: 6 ng/mL — ABNORMAL LOW (ref 15–150)

## 2018-03-16 LAB — SPECIMEN STATUS REPORT

## 2018-08-21 ENCOUNTER — Ambulatory Visit: Payer: 59 | Admitting: Internal Medicine

## 2018-08-23 ENCOUNTER — Other Ambulatory Visit: Payer: Self-pay | Admitting: Internal Medicine

## 2018-08-23 DIAGNOSIS — Z1231 Encounter for screening mammogram for malignant neoplasm of breast: Secondary | ICD-10-CM

## 2018-09-29 ENCOUNTER — Other Ambulatory Visit: Payer: Self-pay | Admitting: Internal Medicine

## 2018-10-15 ENCOUNTER — Ambulatory Visit
Admission: RE | Admit: 2018-10-15 | Discharge: 2018-10-15 | Disposition: A | Discharge status: Home / Self Care | Payer: 59 | Source: Ambulatory Visit | Attending: Internal Medicine | Admitting: Internal Medicine

## 2018-10-15 DIAGNOSIS — Z1231 Encounter for screening mammogram for malignant neoplasm of breast: Secondary | ICD-10-CM

## 2018-10-16 ENCOUNTER — Other Ambulatory Visit: Payer: Self-pay | Admitting: Internal Medicine

## 2018-10-16 DIAGNOSIS — R928 Other abnormal and inconclusive findings on diagnostic imaging of breast: Secondary | ICD-10-CM

## 2018-10-18 ENCOUNTER — Ambulatory Visit
Admission: RE | Admit: 2018-10-18 | Discharge: 2018-10-18 | Disposition: A | Discharge status: Home / Self Care | Payer: 59 | Source: Ambulatory Visit | Attending: Internal Medicine | Admitting: Internal Medicine

## 2018-10-18 ENCOUNTER — Other Ambulatory Visit: Payer: Self-pay

## 2018-10-18 ENCOUNTER — Ambulatory Visit: Payer: 59

## 2018-10-18 DIAGNOSIS — R928 Other abnormal and inconclusive findings on diagnostic imaging of breast: Secondary | ICD-10-CM

## 2019-02-27 ENCOUNTER — Ambulatory Visit (INDEPENDENT_AMBULATORY_CARE_PROVIDER_SITE_OTHER): Payer: 59 | Admitting: Internal Medicine

## 2019-02-27 ENCOUNTER — Other Ambulatory Visit: Payer: Self-pay

## 2019-02-27 ENCOUNTER — Encounter: Payer: Self-pay | Admitting: Internal Medicine

## 2019-02-27 VITALS — BP 124/86 | HR 90 | Temp 98.5°F | Ht 63.0 in | Wt 205.6 lb

## 2019-02-27 DIAGNOSIS — Z23 Encounter for immunization: Secondary | ICD-10-CM

## 2019-02-27 DIAGNOSIS — H6122 Impacted cerumen, left ear: Secondary | ICD-10-CM | POA: Diagnosis not present

## 2019-02-27 DIAGNOSIS — M545 Low back pain, unspecified: Secondary | ICD-10-CM

## 2019-02-27 DIAGNOSIS — Z0000 Encounter for general adult medical examination without abnormal findings: Secondary | ICD-10-CM | POA: Diagnosis not present

## 2019-02-27 LAB — POCT URINALYSIS DIPSTICK
Bilirubin, UA: NEGATIVE
Blood, UA: NEGATIVE
Glucose, UA: NEGATIVE
Ketones, UA: NEGATIVE
Leukocytes, UA: NEGATIVE
Nitrite, UA: NEGATIVE
Protein, UA: NEGATIVE
Spec Grav, UA: 1.02 (ref 1.010–1.025)
Urobilinogen, UA: 0.2 E.U./dL
pH, UA: 5.5 (ref 5.0–8.0)

## 2019-02-27 MED ORDER — CYCLOBENZAPRINE HCL 10 MG PO TABS: 10.0000 mg | ORAL_TABLET | Freq: Three times a day (TID) | ORAL | 0 refills | Status: DC | PRN

## 2019-02-27 NOTE — Progress Notes (Signed)
Subjective:     Patient ID: [name] , female    DOB: [birth year] , 43 y.o.   MRN: [id number]   Chief Complaint  Patient presents with  . Annual Exam    HPI  She is here today for a full physical examination. She is followed by Dr. Garwin Brothers for her GYN exams. Her last visit was Feb/March 2020.     Past Medical History:  Diagnosis Date  . Fibroids      Family History  Problem Relation Age of Onset  . Hypertension Mother   . Hyperthyroidism Mother   . Prostate cancer Maternal Grandfather      Current Outpatient Medications:  .  Ascorbic Acid (VITAMIN C PO), Take by mouth daily., Disp: , Rfl:  .  Cholecalciferol (VITAMIN D PO), Take by mouth daily., Disp: , Rfl:  .  levocetirizine (XYZAL) 5 MG tablet, TAKE 1 TABLET BY MOUTH EVERY DAY IN THE EVENING, Disp: 90 tablet, Rfl: 1 .  Multiple Vitamin (MULTIVITAMIN) tablet, Take 1 tablet by mouth daily., Disp: , Rfl:  .  VITAMIN E PO, Take by mouth daily., Disp: , Rfl:    No Known Allergies    The patient states she uses none for birth control. Last LMP was Patient's last menstrual period was 02/08/2019.. Negative for Dysmenorrhea Negative for: breast discharge, breast lump(s), breast pain and breast self exam. Associated symptoms include abnormal vaginal bleeding. Pertinent negatives include abnormal bleeding (hematology), anxiety, decreased libido, depression, difficulty falling sleep, dyspareunia, history of infertility, nocturia, sexual dysfunction, sleep disturbances, urinary incontinence, urinary urgency, vaginal discharge and vaginal itching. Diet regular.The patient states her exercise level is  intermittent.   . The patient's tobacco use is:  Social History   Tobacco Use  Smoking Status Never Smoker  Smokeless Tobacco Never Used  . She has been exposed to passive smoke. The patient's alcohol use is:  Social History   Substance and Sexual Activity  Alcohol Use Yes  . Alcohol/week: 2.0 standard drinks  . Types: 2  Glasses of wine per week    Review of Systems  Constitutional: Negative.   HENT: Negative.   Eyes: Negative.   Respiratory: Negative.   Cardiovascular: Negative.   Endocrine: Negative.   Genitourinary: Negative.   Musculoskeletal: Positive for back pain.       She c/o right-sided back pain. 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 She has tried some OTC meds for relief. Described as dull,throbbing. Denies LE weakness/paresthesias. She denies urinary sx.   Skin: Negative.   Allergic/Immunologic: Negative.   Neurological: Negative.   Hematological: Negative.   Psychiatric/Behavioral: Negative.      Today's Vitals   02/27/19 0936  BP: 124/86  Pulse: 90  Temp: 98.5 F (36.9 C)  TempSrc: Oral  Weight: 205 lb 9.6 oz (93.3 kg)  Height: 5' 3"  (1.6 m)   Body mass index is 36.42 kg/m.   Objective:  Physical Exam Vitals signs and nursing note reviewed.  Constitutional:      Appearance: Normal appearance. She is obese.  HENT:     Head: Normocephalic and atraumatic.     Comments: Hard cerumen located in left ear    Right Ear: Tympanic membrane, ear canal and external ear normal.     Left Ear: Ear canal and external ear normal. There is impacted cerumen.     Ears:     Comments: Cerumen is hard.     Nose: Nose  normal.     Mouth/Throat:     Mouth: Mucous membranes are moist.     Pharynx: Oropharynx is clear.  Eyes:     Extraocular Movements: Extraocular movements intact.     Conjunctiva/sclera: Conjunctivae normal.     Pupils: Pupils are equal, round, and reactive to light.  Neck:     Musculoskeletal: Normal range of motion and neck supple.  Cardiovascular:     Rate and Rhythm: Normal rate and regular rhythm.     Pulses: Normal pulses.     Heart sounds: Normal heart sounds.  Pulmonary:     Effort: Pulmonary effort is normal.     Breath sounds: Normal breath sounds.  Chest:     Breasts: Tanner Score is 5.        Right: Normal.         Left: Normal.  Abdominal:     General: Abdomen is flat. Bowel sounds are normal.     Palpations: Abdomen is soft.  Genitourinary:    Comments: deferred Musculoskeletal: Normal range of motion.       Arms:     Comments: Tenderness to palpation. Exacerbated with movement. No overlying erythema.  Skin:    General: Skin is warm and dry.  Neurological:     General: No focal deficit present.     Mental Status: She is alert and oriented to person, place, and time.  Psychiatric:        Mood and Affect: Mood normal.        Behavior: Behavior normal.         Assessment And Plan:     1. Routine general medical examination at health care facility  A full exam was performed.  Importance of monthly self breast exams was discussed with the patient. PATIENT HAS BEEN ADVISED TO GET 30-45 MINUTES REGULAR EXERCISE NO LESS THAN FOUR TO FIVE DAYS PER WEEK - BOTH WEIGHTBEARING EXERCISES AND AEROBIC ARE RECOMMENDED.  SHE WAS ADVISED TO FOLLOW A HEALTHY DIET WITH AT LEAST SIX FRUITS/VEGGIES PER DAY, DECREASE INTAKE OF RED MEAT, AND TO INCREASE FISH INTAKE TO TWO DAYS PER WEEK.  MEATS/FISH SHOULD NOT BE FRIED, BAKED OR BROILED IS PREFERABLE.  I SUGGEST WEARING SPF 50 SUNSCREEN ON EXPOSED PARTS AND ESPECIALLY WHEN IN THE DIRECT SUNLIGHT FOR AN EXTENDED PERIOD OF TIME.  PLEASE AVOID FAST FOOD RESTAURANTS AND INCREASE YOUR WATER INTAKE.  - CMP14+EGFR - CBC - Lipid panel - Hemoglobin A1c - TSH  2. Left ear impacted cerumen  AFTER OBTAINING VERBAL CONSENT, LEFT EAR WAS FLUSHED BY IRRIGATION. SHE TOLERATED PROCEDURE WELL WITHOUT ANY COMPLICATIONS. NO TM ABNORMALITIES WERE NOTED.   3. Need for vaccination  - Tdap vaccine greater than or equal to 7yo IM  4. Acute right-sided low back pain without sciatica  She was given rx cyclobenzaprine to take as needed. If persistent, I will consider chirorpractic referral.   [name], MD    THE PATIENT IS ENCOURAGED TO PRACTICE SOCIAL DISTANCING  DUE TO THE COVID-19 PANDEMIC.

## 2019-02-27 NOTE — Patient Instructions (Addendum)
Take magnesium nightly - you can get over the counter Lidocaine patch as needed for back pain I will send muscle relaxer to take nightly as needed.   Health Maintenance, Female Adopting a healthy lifestyle and getting preventive care are important in promoting health and wellness. Ask your health care provider about:  The right schedule for you to have regular tests and exams.  Things you can do on your own to prevent diseases and keep yourself healthy. What should I know about diet, weight, and exercise? Eat a healthy diet   Eat a diet that includes plenty of vegetables, fruits, low-fat dairy products, and lean protein.  Do not eat a lot of foods that are high in solid fats, added sugars, or sodium. Maintain a healthy weight Body mass index (BMI) is used to identify weight problems. It estimates body fat based on height and weight. Your health care provider can help determine your BMI and help you achieve or maintain a healthy weight. Get regular exercise Get regular exercise. This is one of the most important things you can do for your health. Most adults should:  Exercise for at least 150 minutes each week. The exercise should increase your heart rate and make you sweat (moderate-intensity exercise).  Do strengthening exercises at least twice a week. This is in addition to the moderate-intensity exercise.  Spend less time sitting. Even light physical activity can be beneficial. Watch cholesterol and blood lipids Have your blood tested for lipids and cholesterol at 43 years of age, then have this test every 5 years. Have your cholesterol levels checked more often if:  Your lipid or cholesterol levels are high.  You are older than 43 years of age.  You are at high risk for heart disease. What should I know about cancer screening? Depending on your health history and family history, you may need to have cancer screening at various ages. This may include screening for:  Breast  cancer.  Cervical cancer.  Colorectal cancer.  Skin cancer.  Lung cancer. What should I know about heart disease, diabetes, and high blood pressure? Blood pressure and heart disease  High blood pressure causes heart disease and increases the risk of stroke. This is more likely to develop in people who have high blood pressure readings, are of African descent, or are overweight.  Have your blood pressure checked: ? Every 3-5 years if you are 36-15 years of age. ? Every year if you are 91 years old or older. Diabetes Have regular diabetes screenings. This checks your fasting blood sugar level. Have the screening done:  Once every three years after age 40 if you are at a normal weight and have a low risk for diabetes.  More often and at a younger age if you are overweight or have a high risk for diabetes. What should I know about preventing infection? Hepatitis B If you have a higher risk for hepatitis B, you should be screened for this virus. Talk with your health care provider to find out if you are at risk for hepatitis B infection. Hepatitis C Testing is recommended for:  Everyone born from [birth year] through 1965.  Anyone with known risk factors for hepatitis C. Sexually transmitted infections (STIs)  Get screened for STIs, including gonorrhea and chlamydia, if: ? You are sexually active and are younger than 43 years of age. ? You are older than 43 years of age and your health care provider tells you that you are at risk for this type  of infection. ? Your sexual activity has changed since you were last screened, and you are at increased risk for chlamydia or gonorrhea. Ask your health care provider if you are at risk.  Ask your health care provider about whether you are at high risk for HIV. Your health care provider may recommend a prescription medicine to help prevent HIV infection. If you choose to take medicine to prevent HIV, you should first get tested for HIV. You should  then be tested every 3 months for as long as you are taking the medicine. Pregnancy  If you are about to stop having your period (premenopausal) and you may become pregnant, seek counseling before you get pregnant.  Take 400 to 800 micrograms (mcg) of folic acid every day if you become pregnant.  Ask for birth control (contraception) if you want to prevent pregnancy. Osteoporosis and menopause Osteoporosis is a disease in which the bones lose minerals and strength with aging. This can result in bone fractures. If you are 59 years old or older, or if you are at risk for osteoporosis and fractures, ask your health care provider if you should:  Be screened for bone loss.  Take a calcium or vitamin D supplement to lower your risk of fractures.  Be given hormone replacement therapy (HRT) to treat symptoms of menopause. Follow these instructions at home: Lifestyle  Do not use any products that contain nicotine or tobacco, such as cigarettes, e-cigarettes, and chewing tobacco. If you need help quitting, ask your health care provider.  Do not use street drugs.  Do not share needles.  Ask your health care provider for help if you need support or information about quitting drugs. Alcohol use  Do not drink alcohol if: ? Your health care provider tells you not to drink. ? You are pregnant, may be pregnant, or are planning to become pregnant.  If you drink alcohol: ? Limit how much you use to 0-1 drink a day. ? Limit intake if you are breastfeeding.  Be aware of how much alcohol is in your drink. In the [place] one drink equals one 12 oz bottle of beer (355 mL), one 5 oz glass of wine (148 mL), or one 1 oz glass of hard liquor (44 mL). General instructions  Schedule regular health, dental, and eye exams.  Stay current with your vaccines.  Tell your health care provider if: ? You often feel depressed. ? You have ever been abused or do not feel safe at home. Summary  Adopting a  healthy lifestyle and getting preventive care are important in promoting health and wellness.  Follow your health care provider's instructions about healthy diet, exercising, and getting tested or screened for diseases.  Follow your health care provider's instructions on monitoring your cholesterol and blood pressure. This information is not intended to replace advice given to you by your health care provider. Make sure you discuss any questions you have with your health care provider. Document Released: 10/10/2010 Document Revised: 03/20/2018 Document Reviewed: 03/20/2018 Elsevier Patient Education  [place].    Acute Back Pain, Adult Acute back pain is sudden and usually short-lived. It is often caused by an injury to the muscles and tissues in the back. The injury may result from:  A muscle or ligament getting overstretched or torn (strained). Ligaments are tissues that connect bones to each other. Lifting something improperly can cause a back strain.  Wear and tear (degeneration) of the spinal disks. Spinal disks are circular tissue that  provides cushioning between the bones of the spine (vertebrae).  Twisting motions, such as while playing sports or doing yard work.  A hit to the back.  Arthritis. You may have a physical exam, lab tests, and imaging tests to find the cause of your pain. Acute back pain usually goes away with rest and home care. Follow these instructions at home: Managing pain, stiffness, and swelling  Take over-the-counter and prescription medicines only as told by your health care provider.  Your health care provider may recommend applying ice during the first 24-48 hours after your pain starts. To do this: ? Put ice in a plastic bag. ? Place a towel between your skin and the bag. ? Leave the ice on for 20 minutes, 2-3 times a day.  If directed, apply heat to the affected area as often as told by your health care provider. Use the heat source that  your health care provider recommends, such as a moist heat pack or a heating pad. ? Place a towel between your skin and the heat source. ? Leave the heat on for 20-30 minutes. ? Remove the heat if your skin turns bright red. This is especially important if you are unable to feel pain, heat, or cold. You have a greater risk of getting burned. Activity   Do not stay in bed. Staying in bed for more than 1-2 days can delay your recovery.  Sit up and stand up straight. Avoid leaning forward when you sit, or hunching over when you stand. ? If you work at a desk, sit close to it so you do not need to lean over. Keep your chin tucked in. Keep your neck drawn back, and keep your elbows bent at a right angle. Your arms should look like the letter "L." ? Sit high and close to the steering wheel when you drive. Add lower back (lumbar) support to your car seat, if needed.  Take short walks on even surfaces as soon as you are able. Try to increase the length of time you walk each day.  Do not sit, drive, or stand in one place for more than 30 minutes at a time. Sitting or standing for long periods of time can put stress on your back.  Do not drive or use heavy machinery while taking prescription pain medicine.  Use proper lifting techniques. When you bend and lift, use positions that put less stress on your back: ? [place] your knees. ? Keep the load close to your body. ? Avoid twisting.  Exercise regularly as told by your health care provider. Exercising helps your back heal faster and helps prevent back injuries by keeping muscles strong and flexible.  Work with a physical therapist to make a safe exercise program, as recommended by your health care provider. Do any exercises as told by your physical therapist. Lifestyle  Maintain a healthy weight. Extra weight puts stress on your back and makes it difficult to have good posture.  Avoid activities or situations that make you feel anxious or stressed.  Stress and anxiety increase muscle tension and can make back pain worse. Learn ways to manage anxiety and stress, such as through exercise. General instructions  Sleep on a firm mattress in a comfortable position. Try lying on your side with your knees slightly bent. If you lie on your back, put a pillow under your knees.  Follow your treatment plan as told by your health care provider. This may include: ? Cognitive or behavioral therapy. ?  Acupuncture or massage therapy. ? Meditation or yoga. Contact a health care provider if:  You have pain that is not relieved with rest or medicine.  You have increasing pain going down into your legs or buttocks.  Your pain does not improve after 2 weeks.  You have pain at night.  You lose weight without trying.  You have a fever or chills. Get help right away if:  You develop new bowel or bladder control problems.  You have unusual weakness or numbness in your arms or legs.  You develop nausea or vomiting.  You develop abdominal pain.  You feel faint. Summary  Acute back pain is sudden and usually short-lived.  Use proper lifting techniques. When you bend and lift, use positions that put less stress on your back.  Take over-the-counter and prescription medicines and apply heat or ice as directed by your health care provider. This information is not intended to replace advice given to you by your health care provider. Make sure you discuss any questions you have with your health care provider. Document Released: 03/27/2005 Document Revised: 07/16/2018 Document Reviewed: 11/08/2016 Elsevier Patient Education  2020 [employer].

## 2019-02-28 LAB — CBC
Hematocrit: 35.9 % (ref 34.0–46.6)
Hemoglobin: 11.4 g/dL (ref 11.1–15.9)
MCH: 23.9 pg — ABNORMAL LOW (ref 26.6–33.0)
MCHC: 31.8 g/dL (ref 31.5–35.7)
MCV: 75 fL — ABNORMAL LOW (ref 79–97)
Platelets: 360 10*3/uL (ref 150–450)
RBC: 4.76 x10E6/uL (ref 3.77–5.28)
RDW: 15.7 % — ABNORMAL HIGH (ref 11.7–15.4)
WBC: 5.5 10*3/uL (ref 3.4–10.8)

## 2019-02-28 LAB — LIPID PANEL
Chol/HDL Ratio: 3.5 ratio (ref 0.0–4.4)
Cholesterol, Total: 159 mg/dL (ref 100–199)
HDL: 45 mg/dL (ref >39)
LDL Chol Calc (NIH): 97 mg/dL (ref 0–99)
Triglycerides: 92 mg/dL (ref 0–149)
VLDL Cholesterol Cal: 17 mg/dL (ref 5–40)

## 2019-02-28 LAB — CMP14+EGFR
ALT: 24 IU/L (ref 0–32)
AST: 20 IU/L (ref 0–40)
Albumin/Globulin Ratio: 1.5 (ref 1.2–2.2)
Albumin: 4.5 g/dL (ref 3.8–4.8)
Alkaline Phosphatase: 90 IU/L (ref 39–117)
BUN/Creatinine Ratio: 14 (ref 9–23)
BUN: 11 mg/dL (ref 6–24)
Bilirubin Total: 0.3 mg/dL (ref 0.0–1.2)
CO2: 20 mmol/L (ref 20–29)
Calcium: 9.2 mg/dL (ref 8.7–10.2)
Chloride: 104 mmol/L (ref 96–106)
Creatinine, Ser: 0.76 mg/dL (ref 0.57–1.00)
GFR calc Af Amer: 111 mL/min/{1.73_m2} (ref >59)
GFR calc non Af Amer: 96 mL/min/{1.73_m2} (ref >59)
Globulin, Total: 3.1 g/dL (ref 1.5–4.5)
Glucose: 87 mg/dL (ref 65–99)
Potassium: 4.1 mmol/L (ref 3.5–5.2)
Sodium: 140 mmol/L (ref 134–144)
Total Protein: 7.6 g/dL (ref 6.0–8.5)

## 2019-02-28 LAB — HEMOGLOBIN A1C
Est. average glucose Bld gHb Est-mCnc: 120 mg/dL
Hgb A1c MFr Bld: 5.8 % — ABNORMAL HIGH (ref 4.8–5.6)

## 2019-02-28 LAB — TSH: TSH: 2.35 u[IU]/mL (ref 0.450–4.500)

## 2019-04-04 ENCOUNTER — Emergency Department (HOSPITAL_BASED_OUTPATIENT_CLINIC_OR_DEPARTMENT_OTHER)
Admission: EM | Admit: 2019-04-04 | Discharge: 2019-04-04 | Disposition: A | Discharge status: Home / Self Care | Payer: 59 | Attending: Emergency Medicine | Admitting: Emergency Medicine

## 2019-04-04 ENCOUNTER — Encounter (HOSPITAL_BASED_OUTPATIENT_CLINIC_OR_DEPARTMENT_OTHER): Payer: Self-pay | Admitting: *Deleted

## 2019-04-04 ENCOUNTER — Emergency Department (HOSPITAL_BASED_OUTPATIENT_CLINIC_OR_DEPARTMENT_OTHER): Payer: 59

## 2019-04-04 ENCOUNTER — Other Ambulatory Visit: Payer: Self-pay

## 2019-04-04 DIAGNOSIS — R509 Fever, unspecified: Secondary | ICD-10-CM | POA: Diagnosis present

## 2019-04-04 DIAGNOSIS — U071 COVID-19: Secondary | ICD-10-CM | POA: Insufficient documentation

## 2019-04-04 LAB — SARS CORONAVIRUS 2 AG (30 MIN TAT): SARS Coronavirus 2 Ag: POSITIVE — AB

## 2019-04-04 MED ORDER — BENZONATATE 100 MG PO CAPS: 100.0000 mg | ORAL_CAPSULE | Freq: Three times a day (TID) | ORAL | 0 refills | Status: DC

## 2019-04-04 NOTE — ED Notes (Signed)
Portable Xray at bedside.

## 2019-04-04 NOTE — ED Provider Notes (Signed)
[facility] EMERGENCY DEPARTMENT Provider Note   CSN: [id number] Arrival date & time: 04/04/19  1109     History Chief Complaint  Patient presents with  . Fever  . Shortness of Breath    [name] is a 43 y.o. female history of fibroids, bilateral salpingectomy, myomectomy.  Patient presents today requesting Covid test.  She reports that over the past 4 days she has developed nasal congestion, mild nonproductive cough, intermittent low-grade fever, loss of taste and smell, generalized fatigue and chest congestion.  Patient attributed her symptoms to allergies but received a phone call today that a close coworker tested positive for COVID-19 virus and patient would like testing, patient reports that if she did not know her coworker had COVID-19 she would not have come to the emergency department today.  Patient describes nasal congestion as mild. Nonproductive cough as intermittent without pain, sputum, hemoptysis or shortness of breath. Loss of taste and smell as constant x2 days. Generalized fatigue as a tired sensation without focal weakness. Patient reported shortness of breath to triage staff, on my evaluation she reports that she does not truly feel short of breath but feels as if she has "congestion in my throat and upper chest".  Patient reports that she took some Advil yesterday for her symptoms which improved, she has not taken any medication today.  Denies headache, vision changes, sore throat, difficulty swallowing, neck stiffness, chest pain, pleurisy, dyspnea on exertion, abdominal pain, nausea/vomiting, diarrhea, extremity swelling/color change, numbness/tingling, weakness or any additional concerns.  HPI     Past Medical History:  Diagnosis Date  . Fibroids     Patient Active Problem List   Diagnosis Date Noted  . Fibroid 05/04/2015  . Fibroid, uterine     Past Surgical History:  Procedure Laterality Date  . BILATERAL SALPINGECTOMY  2013    along with myomectomy  . IR GENERIC HISTORICAL  11/18/2015   IR RADIOLOGIST EVAL & MGMT 11/18/2015 Sandi Mariscal, MD [facility] INTERV RAD  . ROBOT ASSISTED MYOMECTOMY  2013   for uterine fibroids; along with salpingectomy     OB History   No obstetric history on file.     Family History  Problem Relation Age of Onset  . Hypertension Mother   . Hyperthyroidism Mother   . Prostate cancer Maternal Grandfather     Social History   Tobacco Use  . Smoking status: Never Smoker  . Smokeless tobacco: Never Used  Substance Use Topics  . Alcohol use: Yes    Alcohol/week: 2.0 standard drinks    Types: 2 Glasses of wine per week  . Drug use: No    Home Medications Prior to Admission medications   Medication Sig Start Date End Date Taking? Authorizing Provider  Ascorbic Acid (VITAMIN C PO) Take by mouth daily.   Yes [provider]  Cholecalciferol (VITAMIN D PO) Take by mouth daily.   Yes [provider]  cyclobenzaprine (FLEXERIL) 10 MG tablet Take 1 tablet (10 mg total) by mouth 3 (three) times daily as needed for muscle spasms. 02/27/19  Yes Glendale Chard, MD  levocetirizine (XYZAL) 5 MG tablet TAKE 1 TABLET BY MOUTH EVERY DAY IN THE EVENING 10/04/18  Yes Glendale Chard, MD  Multiple Vitamin (MULTIVITAMIN) tablet Take 1 tablet by mouth daily.   Yes [provider]  VITAMIN E PO Take by mouth daily.   Yes [provider]    Allergies    Patient has no known allergies.  Review of Systems  Review of Systems Ten systems are reviewed and are negative for acute change except as noted in the HPI  Physical Exam Updated Vital Signs BP (!) 154/84   Pulse (!) 102   Temp 99.4 F (37.4 C) (Oral)   Resp 14   Ht 5\' 3"  (1.6 m)   Wt 91.6 kg   LMP 04/03/2019   SpO2 100%   BMI 35.78 kg/m   Physical Exam Constitutional:      General: She is not in acute distress.    Appearance: Normal appearance. She is well-developed. She is not ill-appearing or  diaphoretic.  HENT:     Head: Normocephalic and atraumatic.     Right Ear: External ear normal.     Left Ear: External ear normal.     Nose: Nose normal.  Eyes:     General: Vision grossly intact. Gaze aligned appropriately.     Pupils: Pupils are equal, round, and reactive to light.  Neck:     Trachea: Trachea and phonation normal. No tracheal deviation.  Cardiovascular:     Rate and Rhythm: Normal rate and regular rhythm.     Pulses: Normal pulses.     Heart sounds: Normal heart sounds.  Pulmonary:     Effort: Pulmonary effort is normal. No tachypnea, accessory muscle usage or respiratory distress.     Breath sounds: Normal breath sounds.  Chest:     Chest wall: No mass, deformity or tenderness.  Abdominal:     General: There is no distension.     Palpations: Abdomen is soft.     Tenderness: There is no abdominal tenderness. There is no guarding or rebound.  Musculoskeletal:        General: Normal range of motion.     Cervical back: Normal range of motion.     Right lower leg: No tenderness. No edema.     Left lower leg: No tenderness. No edema.  Skin:    General: Skin is warm and dry.  Neurological:     Mental Status: She is alert.     GCS: GCS eye subscore is 4. GCS verbal subscore is 5. GCS motor subscore is 6.     Comments: Speech is clear and goal oriented, follows commands Major Cranial nerves without deficit, no facial droop Moves extremities without ataxia, coordination intact  Psychiatric:        Behavior: Behavior normal.     ED Results / Procedures / Treatments   Labs (all labs ordered are listed, but only abnormal results are displayed) Labs Reviewed  SARS CORONAVIRUS 2 AG (30 MIN TAT) - Abnormal; Notable for the following components:      Result Value   SARS Coronavirus 2 Ag POSITIVE (*)    All other components within normal limits    EKG EKG Interpretation  Date/Time:  Friday April 04 2019 12:11:53 EST Ventricular Rate:  87 PR Interval:      QRS Duration: 85 QT Interval:  352 QTC Calculation: 424 R Axis:   -4 Text Interpretation: Sinus rhythm No previous ECGs available Confirmed by Fredia Sorrow [id number]) on 04/04/2019 12:29:35 PM   Radiology DG Chest Portable 1 View  Result Date: 04/04/2019 CLINICAL DATA:  Fever, chills, and cough.  COVID-19 exposure. EXAM: PORTABLE CHEST 1 VIEW COMPARISON:  None. FINDINGS: The heart size and mediastinal contours are within normal limits. Both lungs are clear. The visualized skeletal structures are unremarkable. IMPRESSION: No active disease. Electronically Signed   By: Mallie Darting.D.  On: 04/04/2019 12:00    Procedures Procedures (including critical care time)  Medications Ordered in ED Medications - No data to display  ED Course  I have reviewed the triage vital signs and the nursing notes.  Pertinent labs & imaging results that were available during my care of the patient were reviewed by me and considered in my medical decision making (see chart for details).    MDM Rules/Calculators/A&P                     43 year old female arrives requesting COVID-19 test.  She reports she has been having symptoms for the past 3-4 days which she originally attributed to allergies.  She reports that she is overall feeling well and was not planning to come to the ER for her symptoms until she was informed today that a coworker tested positive for COVID-19.  She reports fatigue, nasal congestion, nonproductive cough, loss of taste and smell, nasal congestion and chest congestion, she describes all of her symptoms as mild.  She denies any chest pain, shortness of breath or GI symptoms.  On evaluation patient is well-appearing and in no acute distress, cranial nerves intact, airway clear without evidence of infection, no meningeal signs, heart regular rate and rhythm without murmur, lungs clear to auscultation bilaterally, abdomen soft nontender without peritoneal signs, neurovascular intact to  all 4 extremities without evidence of DVT.  Vital signs stable without hypoxia or tachycardia on room air.  Plan of care is to obtain rapid Covid test, chest x-ray and EKG.  Suspect patient symptoms attributable to viral illness likely COVID-19 virus infection, she has denying chest pain or shortness of breath on my evaluation and has reassuring vital signs, I have low suspicion for ACS, pulmonary embolism, dissection, bacterial pneumonia or other acute pathologies requiring further work-up than above. - Rapid Covid test positive Chest x-ray:  IMPRESSION:  No active disease.   EKG: Sinus rhythm No previous ECGs available Confirmed by Fredia Sorrow [phone]) on 04/04/2019 12:29:35 PM - Patient's Covid test positive today likely as etiology of her symptoms.  Patient was ambulated by nursing staff without hypoxia or shortness of breath.  Patient does not meet criteria for admission and no additional work-up is indicated at this time.  Patient advised to quarantine to avoid spread of COVID-19 virus and to follow-up with PCP via televisit for recheck next week.  Advised symptomatic therapies for viral illnesses including maintaining hydration and getting rest.  At this time there does not appear to be any evidence of an acute emergency medical condition and the patient appears stable for discharge with appropriate outpatient follow up. Diagnosis was discussed with patient who verbalizes understanding of care plan and is agreeable to discharge. I have discussed return precautions with patient who verbalizes understanding of return precautions. Patient encouraged to follow-up with their PCP. All questions answered.  Patient's case discussed with Dr. Rogene Houston who agrees with plan to discharge with follow-up.   [name] was evaluated in Emergency Department on 04/04/2019 for the symptoms described in the history of present illness. She was evaluated in the context of the global COVID-19 pandemic,  which necessitated consideration that the patient might be at risk for infection with the SARS-CoV-2 virus that causes COVID-19. Institutional protocols and algorithms that pertain to the evaluation of patients at risk for COVID-19 are in a state of rapid change based on information released by regulatory bodies including the CDC and federal and state organizations. These policies and algorithms  were followed during the patient's care in the ED.   Note: Portions of this report may have been transcribed using voice recognition software. Every effort was made to ensure accuracy; however, inadvertent computerized transcription errors may still be present. Final Clinical Impression(s) / ED Diagnoses Final diagnoses:  COVID-19 virus infection    Rx / DC Orders ED Discharge Orders    None       Gari Crown 04/04/19 1408    Fredia Sorrow, MD 04/04/19 1443

## 2019-04-04 NOTE — Discharge Instructions (Addendum)
You have been diagnosed today with COVID-19 Virus Infection.  At this time there does not appear to be the presence of an emergent medical condition, however there is always the potential for conditions to change. Please read and follow the below instructions.  Please return to the Emergency Department immediately for any new or worsening symptoms. Please be sure to follow up with your Primary Care Provider within one week regarding your visit today; please call their office to schedule an appointment even if you are feeling better for a follow-up visit. Your Covid test was positive today.  Please quarantine to avoid spread of this virus until you are symptom-free x10 days.  Please call your primary care doctor to inform them of your positive Covid test today and to schedule a follow-up telephone visit.  Please drink plenty of water and get plenty of rest to help with your symptoms.  Get help right away if: You have trouble breathing. You have pain or pressure in your chest. You have confusion. You have bluish lips and fingernails. You have difficulty waking from sleep. You have any new/concerning or worsening of symptoms.  Please read the additional information packets attached to your discharge summary.  Do not take your medicine if  develop an itchy rash, swelling in your mouth or lips, or difficulty breathing; call 911 and seek immediate emergency medical attention if this occurs.  Note: Portions of this text may have been transcribed using voice recognition software. Every effort was made to ensure accuracy; however, inadvertent computerized transcription errors may still be present.

## 2019-04-04 NOTE — ED Triage Notes (Signed)
Pt has had a Covid exposure. C.o fever, chills, cough, sob, loss of taste, loss of smell, fatigue.

## 2019-04-04 NOTE — ED Notes (Signed)
Walked pt. With ambulatory pulse ox. Pt.s Sp02 maintained in the 100% and their Pulse would go from 98-101. Pt. Did well walking and did not complain about any pain.

## 2019-04-21 ENCOUNTER — Telehealth: Payer: Self-pay

## 2019-04-21 ENCOUNTER — Encounter: Payer: Self-pay | Admitting: Internal Medicine

## 2019-04-21 NOTE — Telephone Encounter (Signed)
The patients note has been emailed to her with a signature.

## 2019-04-22 ENCOUNTER — Encounter: Payer: Self-pay | Admitting: Internal Medicine

## 2019-04-22 ENCOUNTER — Other Ambulatory Visit: Payer: Self-pay

## 2019-04-22 ENCOUNTER — Telehealth (INDEPENDENT_AMBULATORY_CARE_PROVIDER_SITE_OTHER): Payer: 59 | Admitting: Internal Medicine

## 2019-04-22 VITALS — BP 174/93 | HR 99 | Temp 98.6°F | Ht 63.0 in | Wt 203.2 lb

## 2019-04-22 DIAGNOSIS — R5383 Other fatigue: Secondary | ICD-10-CM | POA: Diagnosis not present

## 2019-04-22 DIAGNOSIS — U071 COVID-19: Secondary | ICD-10-CM | POA: Diagnosis not present

## 2019-04-22 DIAGNOSIS — J40 Bronchitis, not specified as acute or chronic: Secondary | ICD-10-CM | POA: Diagnosis not present

## 2019-04-22 DIAGNOSIS — R0600 Dyspnea, unspecified: Secondary | ICD-10-CM

## 2019-04-22 DIAGNOSIS — R0609 Other forms of dyspnea: Secondary | ICD-10-CM

## 2019-04-22 MED ORDER — ALBUTEROL SULFATE HFA 108 (90 BASE) MCG/ACT IN AERS: 2.0000 | INHALATION_SPRAY | Freq: Four times a day (QID) | RESPIRATORY_TRACT | 1 refills | Status: DC | PRN

## 2019-04-22 NOTE — Progress Notes (Addendum)
Virtual Visit via Video   This visit type was conducted due to national recommendations for restrictions regarding the COVID-19 Pandemic (e.g. social distancing) in an effort to limit this patient's exposure and mitigate transmission in our community.  Due to her co-morbid illnesses, this patient is at least at moderate risk for complications without adequate follow up.  This format is felt to be most appropriate for this patient at this time.  All issues noted in this document were discussed and addressed.  A limited physical exam was performed with this format.    This visit type was conducted due to national recommendations for restrictions regarding the COVID-19 Pandemic (e.g. social distancing) in an effort to limit this patient's exposure and mitigate transmission in our community.  Patients identity confirmed using two different identifiers.  This format is felt to be most appropriate for this patient at this time.  All issues noted in this document were discussed and addressed.  No physical exam was performed (except for noted visual exam findings with Video Visits).    Date:  04/22/2019   ID:  [name], DOB September 28, 1975, MRN [id number]  Patient Location:  Home  Provider location:   Office   Chief Complaint:  "I had [place] and I can't work"  History of Present Illness:    [name] is a 44 y.o. female who presents via video conferencing for a telehealth visit today.    The patient does have symptoms concerning for COVID-19 infection (fever, chills, cough, or new shortness of breath).   She presents today for virtual visit. She prefers this method of contact due to COVID-19 pandemic.  She reports that she was diagnosed with COVID on Christmas Day. She reports earlier that week she had a sore throat, fever, cough and she felt extremely fatigued. Her co-worker contacted her on Christmas that he was positive. 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 She went to  [facility] ED on Christmas and found out she was positive as well. Although her URI sx have resolved, she is still quite fatigued. She reports she is SOB w/ exertion and is quite tired. She ran some errands yesterday and this wiped her out.     Past Medical History:  Diagnosis Date  . Fibroids    Past Surgical History:  Procedure Laterality Date  . BILATERAL SALPINGECTOMY  2013   along with myomectomy  . IR GENERIC HISTORICAL  11/18/2015   IR RADIOLOGIST EVAL & MGMT 11/18/2015 Sandi Mariscal, MD [facility] INTERV RAD  . ROBOT ASSISTED MYOMECTOMY  2013   for uterine fibroids; along with salpingectomy     Current Meds  Medication Sig  . Ascorbic Acid (VITAMIN C PO) Take by mouth daily.  . Cholecalciferol (VITAMIN D PO) Take by mouth daily.  . cyclobenzaprine (FLEXERIL) 10 MG tablet Take 1 tablet (10 mg total) by mouth 3 (three) times daily as needed for muscle spasms.  . Multiple Vitamin (MULTIVITAMIN) tablet Take 1 tablet by mouth daily.  Marland Kitchen VITAMIN E PO Take by mouth daily.     Allergies:   Patient has no known allergies.   Social History   Tobacco Use  . Smoking status: Never Smoker  . Smokeless tobacco: Never Used  Substance Use Topics  . Alcohol use: Yes    Alcohol/week: 2.0 standard drinks    Types: 2 Glasses of wine per week  . Drug use: No     Family Hx: The patient's family history includes Hypertension in her mother;  Hyperthyroidism in her mother; Prostate cancer in her maternal grandfather.  ROS:   Please see the history of present illness.    Review of Systems  Constitutional: Negative.   Respiratory: Positive for cough and shortness of breath.        She still has a dry cough. Also with SOB with exertion - usually going up steps.   Cardiovascular: Negative.   Gastrointestinal: Negative.   Neurological: Negative.   Psychiatric/Behavioral: Negative.     All other systems reviewed and are negative.   Labs/Other Tests and Data Reviewed:    Recent Labs: 02/27/2019:  ALT 24; BUN 11; Creatinine, Ser 0.76; Hemoglobin 11.4; Platelets 360; Potassium 4.1; Sodium 140; TSH 2.350   Recent Lipid Panel Lab Results  Component Value Date/Time   CHOL 159 02/27/2019 12:18 PM   TRIG 92 02/27/2019 12:18 PM   HDL 45 02/27/2019 12:18 PM   CHOLHDL 3.5 02/27/2019 12:18 PM   LDLCALC 97 02/27/2019 12:18 PM    Wt Readings from Last 3 Encounters:  04/22/19 203 lb 3.2 oz (92.2 kg)  04/04/19 202 lb (91.6 kg)  02/27/19 205 lb 9.6 oz (93.3 kg)     Exam:    Vital Signs:  BP (!) 174/93 Comment: pt provided  Pulse 99 Comment: pt provided  Temp 98.6 F (37 C) Comment: pt provided  Ht 5\' 3"  (1.6 m)   Wt 203 lb 3.2 oz (92.2 kg) Comment: pt provided  LMP 04/03/2019   BMI 36.00 kg/m     Physical Exam  Constitutional: She is oriented to person, place, and time and well-developed, well-nourished, and in no distress.  HENT:  Head: Normocephalic and atraumatic.  Pulmonary/Chest: Effort normal.  She is able to speak in full sentences.   Musculoskeletal:     Cervical back: Normal range of motion.  Neurological: She is alert and oriented to person, place, and time.  Psychiatric: Affect normal.  Nursing note and vitals reviewed.   ASSESSMENT & PLAN:     1. Bronchitis due to COVID-19 virus  Resolving. She was given a note to return to work next week. She is encouraged to continue to practice the 3 W's  - wear mask, wash hands and wait 6 feet apart. She was also advised regarding use of oscillococcinum, a homeopathic remedy. Also advised to continue with vitamin D and C supplementation, even post-infection.   2. Fatigue, unspecified type  Persistent. Pt advised this is a common symptom of COVID. She is encouraged to stay well hydrated and to nap as needed.   3. Dyspnea on exertion  I will send in rx albuterol inhaler to use as needed. She will let me know if her sx persist.     COVID-19 Education: The signs and symptoms of COVID-19 were discussed with the patient  and how to seek care for testing (follow up with PCP or arrange E-visit).  The importance of social distancing was discussed today.  Patient Risk:   After full review of this patients clinical status, I feel that they are at least moderate risk at this time.      Medication Adjustments/Labs and Tests Ordered: Current medicines are reviewed at length with the patient today.  Concerns regarding medicines are outlined above.   Tests Ordered: No orders of the defined types were placed in this encounter.   Medication Changes: Meds ordered this encounter  Medications  . albuterol (VENTOLIN HFA) 108 (90 Base) MCG/ACT inhaler    Sig: Inhale 2 puffs into the lungs  every 6 (six) hours as needed for wheezing or shortness of breath.    Dispense:  18 g    Refill:  1    Disposition:  Follow up prn  Signed, [name], MD

## 2019-06-11 ENCOUNTER — Encounter: Payer: Self-pay | Admitting: Internal Medicine

## 2019-06-11 LAB — HM PAP SMEAR

## 2019-06-13 ENCOUNTER — Ambulatory Visit: Payer: 59 | Attending: Internal Medicine

## 2019-06-13 DIAGNOSIS — Z23 Encounter for immunization: Secondary | ICD-10-CM

## 2019-06-13 NOTE — Progress Notes (Signed)
   Covid-19 Vaccination Clinic  Name:  [name]    MRN: [id number] DOB: [birth year]  06/13/2019  [name] was observed post Covid-19 immunization for 15 minutes without incident. She was provided with Vaccine Information Sheet and instruction to access the V-Safe system.   [name] was instructed to call 911 with any severe reactions post vaccine: Marland Kitchen Difficulty breathing  . Swelling of face and throat  . A fast heartbeat  . A bad rash all over body  . Dizziness and weakness   Immunizations Administered    Name Date Dose VIS Date Route   Pfizer COVID-19 Vaccine 06/13/2019  9:52 AM 0.3 mL 03/21/2019 Intramuscular   Manufacturer: [place]   Lot: [id number]   [place]: [id number]

## 2019-07-09 ENCOUNTER — Ambulatory Visit: Payer: 59 | Attending: Internal Medicine

## 2019-07-09 DIAGNOSIS — Z23 Encounter for immunization: Secondary | ICD-10-CM

## 2019-07-09 NOTE — Progress Notes (Signed)
   Covid-19 Vaccination Clinic  Name:  [name]    MRN: [id number] DOB: January 24, 1976  07/09/2019  [name] was observed post Covid-19 immunization for 15 minutes without incident. She was provided with Vaccine Information Sheet and instruction to access the V-Safe system.   [name] was instructed to call 911 with any severe reactions post vaccine: Marland Kitchen Difficulty breathing  . Swelling of face and throat  . A fast heartbeat  . A bad rash all over body  . Dizziness and weakness   Immunizations Administered    Name Date Dose VIS Date Route   Pfizer COVID-19 Vaccine 07/09/2019  9:03 AM 0.3 mL 03/21/2019 Intramuscular   Manufacturer: [place]   Lot: [id number]   [place]: [id number]

## 2019-09-15 ENCOUNTER — Ambulatory Visit (INDEPENDENT_AMBULATORY_CARE_PROVIDER_SITE_OTHER): Payer: 59 | Admitting: Internal Medicine

## 2019-09-15 ENCOUNTER — Encounter: Payer: Self-pay | Admitting: Internal Medicine

## 2019-09-15 ENCOUNTER — Other Ambulatory Visit: Payer: Self-pay

## 2019-09-15 VITALS — BP 132/86 | HR 92 | Temp 98.9°F | Ht 63.0 in | Wt 206.0 lb

## 2019-09-15 DIAGNOSIS — Z8616 Personal history of COVID-19: Secondary | ICD-10-CM | POA: Insufficient documentation

## 2019-09-15 DIAGNOSIS — E6609 Other obesity due to excess calories: Secondary | ICD-10-CM | POA: Diagnosis not present

## 2019-09-15 DIAGNOSIS — E66812 Obesity, class 2: Secondary | ICD-10-CM | POA: Insufficient documentation

## 2019-09-15 DIAGNOSIS — G44209 Tension-type headache, unspecified, not intractable: Secondary | ICD-10-CM | POA: Diagnosis not present

## 2019-09-15 DIAGNOSIS — R0683 Snoring: Secondary | ICD-10-CM | POA: Insufficient documentation

## 2019-09-15 DIAGNOSIS — R030 Elevated blood-pressure reading, without diagnosis of hypertension: Secondary | ICD-10-CM | POA: Diagnosis not present

## 2019-09-15 DIAGNOSIS — Z6836 Body mass index (BMI) 36.0-36.9, adult: Secondary | ICD-10-CM

## 2019-09-15 NOTE — Patient Instructions (Signed)
How to Take Your Blood Pressure You can take your blood pressure at home with a machine. You may need to check your blood pressure at home:  To check if you have high blood pressure (hypertension).  To check your blood pressure over time.  To make sure your blood pressure medicine is working. Supplies needed: You will need a blood pressure machine, or monitor. You can buy one at a drugstore or online. When choosing one:  Choose one with an arm cuff.  Choose one that wraps around your upper arm. Only one finger should fit between your arm and the cuff.  Do not choose one that measures your blood pressure from your wrist or finger. Your doctor can suggest a monitor. How to prepare Avoid these things for 30 minutes before checking your blood pressure:  Drinking caffeine.  Drinking alcohol.  Eating.  Smoking.  Exercising. Five minutes before checking your blood pressure:  Pee.  Sit in a dining chair. Avoid sitting in a soft couch or armchair.  Be quiet. Do not talk. How to take your blood pressure Follow the instructions that came with your machine. If you have a digital blood pressure monitor, these may be the instructions: 1. Sit up straight. 2. Place your feet on the floor. Do not cross your ankles or legs. 3. Rest your left arm at the level of your heart. You may rest it on a table, desk, or chair. 4. Pull up your shirt sleeve. 5. Wrap the blood pressure cuff around the upper part of your left arm. The cuff should be 1 inch (2.5 cm) above your elbow. It is best to wrap the cuff around bare skin. 6. Fit the cuff snugly around your arm. You should be able to place only one finger between the cuff and your arm. 7. Put the cord inside the groove of your elbow. 8. Press the power button. 9. Sit quietly while the cuff fills with air and loses air. 10. Write down the numbers on the screen. 11. Wait 2-3 minutes and then repeat steps 1-10. What do the numbers mean? Two  numbers make up your blood pressure. The first number is called systolic pressure. The second is called diastolic pressure. An example of a blood pressure reading is "120 over 80" (or 120/80). If you are an adult and do not have a medical condition, use this guide to find out if your blood pressure is normal: Normal  First number: below 120.  Second number: below 80. Elevated  First number: 120-129.  Second number: below 80. Hypertension stage 1  First number: 130-139.  Second number: 80-89. Hypertension stage 2  First number: 140 or above.  Second number: 90 or above. Your blood pressure is above normal even if only the top or bottom number is above normal. Follow these instructions at home:  Check your blood pressure as often as your doctor tells you to.  Take your monitor to your next doctor's appointment. Your doctor will: ? Make sure you are using it correctly. ? Make sure it is working right.  Make sure you understand what your blood pressure numbers should be.  Tell your doctor if your medicines are causing side effects. Contact a doctor if:  Your blood pressure keeps being high. Get help right away if:  Your first blood pressure number is higher than 180.  Your second blood pressure number is higher than 120. This information is not intended to replace advice given to you by your health   care provider. Make sure you discuss any questions you have with your health care provider. Document Revised: 03/09/2017 Document Reviewed: 09/03/2015 Elsevier Patient Education  2020 [employer].

## 2019-09-16 ENCOUNTER — Encounter: Payer: Self-pay | Admitting: Internal Medicine

## 2019-09-24 ENCOUNTER — Other Ambulatory Visit: Payer: Self-pay | Admitting: Internal Medicine

## 2019-09-24 DIAGNOSIS — Z1231 Encounter for screening mammogram for malignant neoplasm of breast: Secondary | ICD-10-CM

## 2019-09-25 NOTE — Progress Notes (Signed)
This visit occurred during the SARS-CoV-2 public health emergency.  Safety protocols were in place, including screening questions prior to the visit, additional usage of staff PPE, and extensive cleaning of exam room while observing appropriate contact time as indicated for disinfecting solutions.  Subjective:     Patient ID: [name] , female    DOB: 12-03-1975 , 44 y.o.   MRN: [id number]   Chief Complaint  Patient presents with  . Covid complications    HPI  She presents today for further evaluation of post-COVID symptoms. She c/o fatigue, headaches and daytime somnolence. 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 She denies change in appetite and sleep habits.   Headaches described as temporal, throbbing and intermittent. No associated nausea or visual disturbances. Admits to stress at work. Denies past h/o frequent headaches/migraines.     Past Medical History:  Diagnosis Date  . Fibroids      Family History  Problem Relation Age of Onset  . Hypertension Mother   . Hyperthyroidism Mother   . Prostate cancer Maternal Grandfather      Current Outpatient Medications:  .  albuterol (VENTOLIN HFA) 108 (90 Base) MCG/ACT inhaler, Inhale 2 puffs into the lungs every 6 (six) hours as needed for wheezing or shortness of breath., Disp: 18 g, Rfl: 1 .  Ascorbic Acid (VITAMIN C PO), Take by mouth daily., Disp: , Rfl:  .  Cholecalciferol (VITAMIN D PO), Take by mouth daily., Disp: , Rfl:  .  cyclobenzaprine (FLEXERIL) 10 MG tablet, Take 1 tablet (10 mg total) by mouth 3 (three) times daily as needed for muscle spasms., Disp: 30 tablet, Rfl: 0 .  levocetirizine (XYZAL) 5 MG tablet, TAKE 1 TABLET BY MOUTH EVERY DAY IN THE EVENING, Disp: 90 tablet, Rfl: 1 .  Multiple Vitamin (MULTIVITAMIN) tablet, Take 1 tablet by mouth daily., Disp: , Rfl:  .  VITAMIN E PO, Take by mouth daily., Disp: , Rfl:    No Known Allergies   Review of Systems  Constitutional: Positive  for fatigue.  Respiratory: Negative.   Cardiovascular: Negative.   Gastrointestinal: Negative.   Neurological: Negative.   Psychiatric/Behavioral: Negative.      Today's Vitals   09/15/19 1549  BP: 132/86  Pulse: 92  Temp: 98.9 F (37.2 C)  TempSrc: Oral  Weight: 206 lb (93.4 kg)  Height: 5\' 3"  (1.6 m)   Body mass index is 36.49 kg/m.   Objective:  Physical Exam Vitals and nursing note reviewed.  Constitutional:      Appearance: Normal appearance. She is obese.  HENT:     Head: Normocephalic and atraumatic.  Cardiovascular:     Rate and Rhythm: Normal rate and regular rhythm.     Heart sounds: Normal heart sounds.  Pulmonary:     Effort: Pulmonary effort is normal.     Breath sounds: Normal breath sounds.  Skin:    General: Skin is warm.  Neurological:     General: No focal deficit present.     Mental Status: She is alert.  Psychiatric:        Mood and Affect: Mood normal.        Behavior: Behavior normal.         Assessment And Plan:     1. Tension headache  She is advised to start magnesium 400mg  nightly  2. Elevated blood pressure reading  She is encouraged to avoid adding salt to her foods. She may benefit from magnesium nightly. She agrees to  rto in 6-8 weeks for re-evaluation.   3. Snoring  She also admits to daytime somnolence, fatigue, also with headaches - sx suggestive of OSA. She agrees to Neuro referral for sleep study evaluation.   - Ambulatory referral to Neurology  4. Class 2 obesity due to excess calories without serious comorbidity with body mass index (BMI) of 36.0 to 36.9 in adult  She is encouraged to strive for BMI less than 30 to decrease cardiac risk.  Importance of regular exercise was discussed with the patient. She is to aim for at least 150 minutes of moderate exercise per week.   5. Personal history of covid-19   [name], MD    THE PATIENT IS ENCOURAGED TO PRACTICE SOCIAL DISTANCING DUE TO THE COVID-19  PANDEMIC.

## 2019-09-26 ENCOUNTER — Telehealth: Payer: Self-pay

## 2019-09-26 NOTE — Telephone Encounter (Signed)
The pt was told that her FlMA form has been completed and is ready for pick up.

## 2019-09-30 ENCOUNTER — Other Ambulatory Visit: Payer: Self-pay

## 2019-09-30 ENCOUNTER — Ambulatory Visit: Payer: 59

## 2019-09-30 VITALS — BP 128/76 | HR 84 | Temp 97.7°F | Ht 63.0 in | Wt 204.2 lb

## 2019-09-30 DIAGNOSIS — R030 Elevated blood-pressure reading, without diagnosis of hypertension: Secondary | ICD-10-CM

## 2019-09-30 NOTE — Progress Notes (Signed)
Pt presents today for b/p check 128/76 pt stated today she was not rushing to her visit.

## 2019-10-08 ENCOUNTER — Other Ambulatory Visit: Payer: Self-pay

## 2019-10-08 ENCOUNTER — Encounter: Payer: Self-pay | Admitting: Neurology

## 2019-10-08 ENCOUNTER — Ambulatory Visit (INDEPENDENT_AMBULATORY_CARE_PROVIDER_SITE_OTHER): Payer: 59 | Admitting: Neurology

## 2019-10-08 ENCOUNTER — Other Ambulatory Visit: Payer: Self-pay | Admitting: Neurology

## 2019-10-08 VITALS — BP 158/86 | HR 104 | Ht 63.0 in | Wt 205.0 lb

## 2019-10-08 DIAGNOSIS — G933 Postviral fatigue syndrome: Secondary | ICD-10-CM

## 2019-10-08 DIAGNOSIS — R0683 Snoring: Secondary | ICD-10-CM

## 2019-10-08 DIAGNOSIS — I10 Essential (primary) hypertension: Secondary | ICD-10-CM

## 2019-10-08 DIAGNOSIS — U071 COVID-19: Secondary | ICD-10-CM

## 2019-10-08 DIAGNOSIS — G9331 Postviral fatigue syndrome: Secondary | ICD-10-CM

## 2019-10-08 NOTE — Progress Notes (Signed)
SLEEP MEDICINE CLINIC    Provider:  Larey Seat, MD  Primary Care Physician:  Glendale Chard, [address] [place] [address]     Referring Provider: Glendale Chard, [address] [place],  [place] [address]          Chief Complaint according to patient   Patient presents with:    . New Patient (Initial Visit)     pt alone, rm 11. presents today due to waking up tired and and with headache. sleeps 6-7 hrs and doesnt wake up. + snore in sleep. never had a SS      HISTORY OF PRESENT ILLNESS:  [name] is a 44 year- old African American female patient and seen on 10/08/2019 upon referral from Glendale Chard, MD  Chief concern according to patient: " I snore, I am fatigued and I am overweight- since I contracted Covid 19 at Christmas 2020 I have been feeling much more tired and have woken up with headaches I had never noticed before."  I have the pleasure of seeing [name] today, a right -handed African-American female with a  sleep related headache disorder, post COVID infection.   She  has a past medical history of Fibroids. allergic rhinitis, morbid obesity ( BMI over 35) , had COVID 19 but  denies nocturia, only knows that she snores, and she sleeps through the night. Family medical /sleep history: No other family member on CPAP with OSA, but she has a strong suspicions that a cousin has it.   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 Family status is divorced, no children.  The patient currently works only daytime - she s also owns a Surveyor, quantity business.  Pets are not  Present. Tobacco use: none.  ETOH use; rare ,  Caffeine intake in form of Coffee( 2-3 a week) Soda( 3-4 week) Tea ( 3-4 / week ) no energy drinks. Regular exercise in form of physical job. Hobbies : creative, arts and crafts.     Sleep habits are as follows: The patient's dinner time is between 6-7  PM.  The patient goes to bed at 11 PM and continues to sleep for many hours, wakes rarely for bathroom breaks,The preferred sleep position is on her sides, with the support of 4 pillows. Body pillows.  Dreams are reportedly frequent, 2-3 times a week..  6.30  AM is the usual rise time.  The patient wakes up at 6 with an alarm.  She reports not feeling refreshed or restored in AM, with symptoms such as dry mouth , morning headaches-( but doesn't wake up due to headaches) , and residual fatigue.  Naps are taken on week days and weekends frequently, lasting from 20-45 minutes and are more refreshing than nocturnal sleep.  She tends to sleep longer on WE, until about 8 AM>    Review of Systems: Out of a complete 14 system review, the patient complains of only the following symptoms, and all other reviewed systems are negative.:  Fatigue, sleepiness , snoring, fragmented sleep, Hypersomnia How likely are you to doze in the following situations: 0 = not likely, 1 = slight chance, 2 = moderate chance, 3 = high chance   Sitting and Reading? Watching Television? Sitting inactive in a public place (theater or meeting)? As a passenger in a car for an hour without a break? Lying down in the afternoon  when circumstances permit? Sitting and talking to someone? Sitting quietly after lunch without alcohol? In a car, while stopped for a few minutes in traffic?   Total = 15/ 24 points   FSS endorsed at 28/ 63 points.   She reports that during COVID illness her sleep was 12 hours or more a day, always feeling fatigued, reported temporary loss of smell and taste, had a fever for many days and nights, and persistent myalgia.  .   Social History   Socioeconomic History  . Marital status: Single    Spouse name: Not on file  . Number of children: Not on file  . Years of education: Not on file  . Highest education level: Not on file  Occupational History  . Not on file  Tobacco Use  . Smoking status:  Never Smoker  . Smokeless tobacco: Never Used  Vaping Use  . Vaping Use: Never used  Substance and Sexual Activity  . Alcohol use: Yes    Alcohol/week: 2.0 standard drinks    Types: 2 Glasses of wine per week  . Drug use: No  . Sexual activity: Yes    Birth control/protection: None  Other Topics Concern  . Not on file  Social History Narrative  . Not on file   Social Determinants of Health   Financial Resource Strain:   . Difficulty of Paying Living Expenses:   Food Insecurity:   . Worried About Charity fundraiser in the Last Year:   . Arboriculturist in the Last Year:   Transportation Needs:   . Film/video editor (Medical):   Marland Kitchen Lack of Transportation (Non-Medical):   Physical Activity:   . Days of Exercise per Week:   . Minutes of Exercise per Session:   Stress:   . Feeling of Stress :   Social Connections:   . Frequency of Communication with Friends and Family:   . Frequency of Social Gatherings with Friends and Family:   . Attends Religious Services:   . Active Member of Clubs or Organizations:   . Attends Archivist Meetings:   Marland Kitchen Marital Status:     Family History  Problem Relation Age of Onset  . Hypertension Mother   . Hyperthyroidism Mother   . Prostate cancer Maternal Grandfather     Past Medical History:  Diagnosis Date  . Fibroids     Past Surgical History:  Procedure Laterality Date  . BILATERAL SALPINGECTOMY  2013   along with myomectomy  . IR GENERIC HISTORICAL  11/18/2015   IR RADIOLOGIST EVAL & MGMT 11/18/2015 Sandi Mariscal, MD [facility] INTERV RAD  . ROBOT ASSISTED MYOMECTOMY  2013   for uterine fibroids; along with salpingectomy     Current Outpatient Medications on File Prior to Visit  Medication Sig Dispense Refill  . albuterol (VENTOLIN HFA) 108 (90 Base) MCG/ACT inhaler Inhale 2 puffs into the lungs every 6 (six) hours as needed for wheezing or shortness of breath. 18 g 1  . Ascorbic Acid (VITAMIN C PO) Take by mouth daily.     . Cholecalciferol (VITAMIN D PO) Take by mouth daily.    . cyclobenzaprine (FLEXERIL) 10 MG tablet Take 1 tablet (10 mg total) by mouth 3 (three) times daily as needed for muscle spasms. 30 tablet 0  . levocetirizine (XYZAL) 5 MG tablet TAKE 1 TABLET BY MOUTH EVERY DAY IN THE EVENING 90 tablet 1  . Multiple Vitamin (MULTIVITAMIN) tablet Take 1 tablet by mouth daily.    Marland Kitchen  VITAMIN E PO Take by mouth daily.     No current facility-administered medications on file prior to visit.    Physical exam:  Today's Vitals   10/08/19 1450  BP: (!) 158/86  Pulse: (!) 104  Weight: 205 lb (93 kg)  Height: 5\' 3"  (1.6 m)   Body mass index is 36.31 kg/m.   Wt Readings from Last 3 Encounters:  10/08/19 205 lb (93 kg)  09/30/19 204 lb 3.2 oz (92.6 kg)  09/15/19 206 lb (93.4 kg)     Ht Readings from Last 3 Encounters:  10/08/19 5\' 3"  (1.6 m)  09/30/19 5\' 3"  (1.6 m)  09/15/19 5\' 3"  (1.6 m)      General: The patient is awake, alert and appears not in acute distress. The patient is well groomed. Head: Normocephalic, atraumatic. Neck is supple.  Mallampati 3 plus-,  neck circumference:17.5  inches . Nasal airflow  patent.  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 Scalloped tongue.  Dental status: intact. Cardiovascular:  Regular rate and cardiac rhythm by pulse,  without distended neck veins. Respiratory: Lungs are clear to auscultation.  Skin:  Without evidence of ankle edema, or rash. Trunk: The patient's posture is erect.   Neurologic exam : The patient is awake and alert, oriented to place and time.   Memory subjective described as intact.  Attention span & concentration ability appears normal.  Speech is fluent,  without  dysarthria, dysphonia or aphasia.  Mood and affect are appropriate.   Cranial nerves: no loss of smell or taste reported  Pupils are equal and briskly reactive to light. Funduscopic exam deferred.   Extraocular movements in vertical and horizontal planes were  intact and  without nystagmus.  No Diplopia. Visual fields by finger perimetry are intact. Hearing was intact to soft voice and finger rubbing.    Facial sensation intact to fine touch.  Facial motor strength is symmetric and tongue and uvula move midline.  Neck ROM : rotation, tilt and flexion extension were normal for age and shoulder shrug was symmetrical.    Motor exam:  Symmetric bulk, tone and ROM.   Normal tone without cog- wheeling, symmetric grip strength .   Sensory:  Reports some numbness in her right dominant hand , thumb and index finger.  Fine touch, pinprick and vibration were normal.  Proprioception tested in the upper extremities was normal.   Coordination: Rapid alternating movements in the fingers/hands were of normal speed.  The Finger-to-nose maneuver was intact without evidence of ataxia, dysmetria or tremor.   Gait and station: Patient could rise unassisted from a seated position, walked without assistive device.  Stance is of normal width/ base and the patient turned with 3 steps.  Toe and heel walk were deferred.  Deep tendon reflexes: in the  upper and lower extremities are symmetrically attenuated-  and intact.  Babinski response was deferred .       After spending a total time of  40 minutes face to face and additional time for physical and neurologic examination, review of laboratory studies,  personal review of imaging studies, reports and results of other testing and review of referral information / records as far as provided in visit, I have established the following assessments: She reports that during COVID illness her sleep was 12 hours or more a day, always feeling fatigued, loss of smell and taste, fever for many days and nights, and myalgia.  She has felt as if there is a lingering postviral fatigue, and she did  first notice morning headaches in that period. 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  1) OSA and hypoxemia screening. 2) HTN , not well controlled- ever since her divorce and related stress.  3) morning headaches and EDS- but reports good sleep at night.  4) "My brain is not as sharp since COVID" - we may test memory in the future after we conlude the sleep evaluation. .    My Plan is to proceed with:  1)I can use a HST or PSG for screening both conditions. I am curious about the long term effects COVID may have had on her fatigue and if those are related to hypoxia.     I would like to thank Glendale Chard, MD and Glendale Chard, [address] [place],  [place] [address] for allowing me to meet with and to take care of this pleasant patient.   I plan to follow up either personally or through our NP within 3  month.   CC: I will share my notes with PCP.  Electronically signed by: Larey Seat, MD 10/08/2019 2:59 PM  [facility] Board certified by The [employer] of Sleep Medicine and Diplomate of the [employer] of Sleep Medicine. Board certified In Neurology through the [place], Fellow of the [employer] of Neurology. Medical Director of [facility].

## 2019-10-13 NOTE — Patient Instructions (Signed)

## 2019-10-15 ENCOUNTER — Telehealth: Payer: Self-pay

## 2019-10-15 NOTE — Telephone Encounter (Signed)
LVM for pt to call me back to schedule sleep study  

## 2019-10-17 ENCOUNTER — Ambulatory Visit: Payer: 59

## 2019-10-21 ENCOUNTER — Other Ambulatory Visit: Payer: Self-pay

## 2019-10-21 ENCOUNTER — Ambulatory Visit
Admission: RE | Admit: 2019-10-21 | Discharge: 2019-10-21 | Disposition: A | Discharge status: Home / Self Care | Payer: 59 | Source: Ambulatory Visit | Attending: Internal Medicine | Admitting: Internal Medicine

## 2019-10-21 DIAGNOSIS — Z1231 Encounter for screening mammogram for malignant neoplasm of breast: Secondary | ICD-10-CM

## 2019-10-23 ENCOUNTER — Telehealth: Payer: Self-pay

## 2019-10-23 NOTE — Telephone Encounter (Signed)
LVM for pt to call me back to schedule sleep study  

## 2019-11-04 ENCOUNTER — Ambulatory Visit (INDEPENDENT_AMBULATORY_CARE_PROVIDER_SITE_OTHER): Payer: 59 | Admitting: Neurology

## 2019-11-04 DIAGNOSIS — G4733 Obstructive sleep apnea (adult) (pediatric): Secondary | ICD-10-CM

## 2019-11-04 DIAGNOSIS — G9331 Postviral fatigue syndrome: Secondary | ICD-10-CM

## 2019-11-04 DIAGNOSIS — I10 Essential (primary) hypertension: Secondary | ICD-10-CM

## 2019-11-04 DIAGNOSIS — R0683 Snoring: Secondary | ICD-10-CM

## 2019-11-04 DIAGNOSIS — G478 Other sleep disorders: Secondary | ICD-10-CM

## 2019-11-04 DIAGNOSIS — G933 Postviral fatigue syndrome: Secondary | ICD-10-CM

## 2019-11-25 DIAGNOSIS — G478 Other sleep disorders: Secondary | ICD-10-CM | POA: Insufficient documentation

## 2019-11-25 DIAGNOSIS — G4733 Obstructive sleep apnea (adult) (pediatric): Secondary | ICD-10-CM | POA: Insufficient documentation

## 2019-11-25 NOTE — Procedures (Signed)
PATIENT'S NAME:  [name], [name] DOB:      1975/12/12      MR#:    [id number]     DATE OF RECORDING: 11/04/2019  [facility] REFERRING M.D.:  Glendale Chard MD Study Performed:   Baseline Polysomnogram HISTORY:  [name] is a 44- year- old African American female patient and seen on 10/08/2019 upon referral from Glendale Chard, MD  Chief concern: " I snore, I am fatigued, and I am overweight- since I contracted Covid 19 at Christmas 2020 I have been feeling much more tired and have woken up with headaches which I had never noticed before."   I have the pleasure of seeing [name] today, a right -handed African American female with a possibly sleep related headache disorder, post COVID infection.  She has a past medical history of Fibroids. allergic rhinitis, morbid obesity (BMI over 35), had COVID 19 but denies nocturia, only knows that she snores, and she sleeps through the night.   She reports not feeling refreshed or restored in AM, with symptoms such as dry mouth, morning headaches (but doesn't wake up due to headaches) , and residual fatigue.   The patient endorsed the Epworth Sleepiness Scale at 15/24 points.   The patient's weight 205 pounds with a height of 63 (inches), resulting in a BMI of 36.3 kg/m2. The patient's neck circumference measured 17.5 inches.  CURRENT MEDICATIONS: Ventolin, Vitamin C, Vitamin D, Flexeril, Xyzal.   PROCEDURE:  This is a multichannel digital polysomnogram utilizing the Somnostar 11.2 system.  Electrodes and sensors were applied and monitored per AASM Specifications.   EEG, EOG, Chin and Limb EMG, were sampled at 200 Hz.  ECG, Snore and Nasal Pressure, Thermal Airflow, Respiratory Effort, CPAP Flow and Pressure, Oximetry was sampled at 50 Hz. Digital video and audio were recorded.      BASELINE STUDY: Lights Out was at 21:27 and Lights On at 05:00.  Total recording time (TRT) was 453.5 minutes, with a total sleep time (TST) of 404.5 minutes.   The  patient's sleep latency was 17 minutes.  REM latency was 112.5 minutes.  The sleep efficiency was 89.2 %.     SLEEP ARCHITECTURE: WASO (Wake after sleep onset) was 31.5 minutes.  There were 10.5 minutes in Stage N1, 266 minutes Stage N2, 35.5 minutes Stage N3 and 92.5 minutes in Stage REM.  The percentage of Stage N1 was 2.6%, Stage N2 was 65.8%, Stage N3 was 8.8% and Stage R (REM sleep) was 22.9%.   RESPIRATORY ANALYSIS:  There were a total of 64 respiratory events:  39 obstructive apneas, 6 central apneas and 0 mixed apneas with a total of 45 apneas and an apnea index (AI) of 6.7 /hour. There were 19 hypopneas with a hypopnea index of 2.8 /hour. The patient also had 0 respiratory event related arousals (RERAs).      The total APNEA/HYPOPNEA INDEX (AHI) was 9.5/hour.  55 events occurred in REM sleep and 14 events in NREM. The REM AHI was 35.7 /hour, versus a non-REM AHI of 1.7.  The patient spent 322 minutes of total sleep time in the supine position and 83 minutes in non-supine.. The supine AHI was 11.9 versus a non-supine AHI of 0.0.  OXYGEN SATURATION & C02:  The Wake baseline 02 saturation was 95%, with the lowest being 73%. Time spent below 89% saturation equaled 17 minutes.   The arousals were noted as: 22 were spontaneous, 0 were associated with PLMs, 8 were associated with respiratory events.  Audio and video analysis did show REM sleep related phonations or vocalizations.   Snoring was noted, clustered in REM sleep. EKG was in keeping with normal sinus rhythm (NSR).   IMPRESSION:  1. Mild Obstructive Sleep Apnea(OSA) at AHI 9.5 with strong REM sleep dependence, The REM AHI was 35.7/h.  2. REM sleep related sleep talking.  3. No prolonged hypoxemia during sleep- this makes correlation to any headache disorder difficult.  4. No Periodic Limb Movement Disorder (PLMD). 5. Normal EKG.  RECOMMENDATIONS:  1. Advise full-night, attended, CPAP titration study to optimize therapy.      I certify that I have reviewed the entire raw data recording prior to the issuance of this report in accordance with the Standards of Accreditation of the American Academy of Sleep Medicine (AASM)    Larey Seat, MD Diplomat, American Board of Psychiatry and Neurology  Diplomat, American Board of Sleep Medicine Market researcher, [facility] [employer]

## 2019-11-25 NOTE — Progress Notes (Signed)
IMPRESSION:   1. Mild Obstructive Sleep Apnea(OSA) at AHI 9.5 with strong REM  sleep dependence, The REM AHI was 35.7/h.  2. REM sleep related sleep talking.  3. No prolonged hypoxemia during sleep- this makes correlation to  any headache disorder difficult.  4. No Periodic Limb Movement Disorder (PLMD).  5. Normal EKG.   RECOMMENDATIONS:   1. Advise full-night, attended, CPAP titration study to optimize  therapy.

## 2019-11-25 NOTE — Addendum Note (Signed)
Addended by: Larey Seat on: 11/25/2019 05:42 PM   Modules accepted: Orders

## 2019-11-26 ENCOUNTER — Telehealth: Payer: Self-pay | Admitting: Neurology

## 2019-11-26 NOTE — Telephone Encounter (Signed)
-----   Message from Larey Seat, MD sent at 11/25/2019  5:42 PM EDT ----- IMPRESSION:   1. Mild Obstructive Sleep Apnea(OSA) at AHI 9.5 with strong REM  sleep dependence, The REM AHI was 35.7/h.  2. REM sleep related sleep talking.  3. No prolonged hypoxemia during sleep- this makes correlation to  any headache disorder difficult.  4. No Periodic Limb Movement Disorder (PLMD).  5. Normal EKG.   RECOMMENDATIONS:   1. Advise full-night, attended, CPAP titration study to optimize  therapy.

## 2019-11-26 NOTE — Telephone Encounter (Signed)
Called patient to discuss sleep study results. No answer at this time. LVM for the patient to call back.   

## 2019-12-08 ENCOUNTER — Encounter: Payer: Self-pay | Admitting: Internal Medicine

## 2019-12-08 ENCOUNTER — Ambulatory Visit (INDEPENDENT_AMBULATORY_CARE_PROVIDER_SITE_OTHER): Payer: 59 | Admitting: Neurology

## 2019-12-08 DIAGNOSIS — G4733 Obstructive sleep apnea (adult) (pediatric): Secondary | ICD-10-CM | POA: Diagnosis not present

## 2019-12-08 DIAGNOSIS — G478 Other sleep disorders: Secondary | ICD-10-CM

## 2019-12-08 DIAGNOSIS — I10 Essential (primary) hypertension: Secondary | ICD-10-CM

## 2019-12-08 DIAGNOSIS — G9331 Postviral fatigue syndrome: Secondary | ICD-10-CM

## 2019-12-18 NOTE — Procedures (Signed)
PATIENT'S NAME:  [name], [name] DOB:      1976-01-12      MR#:    [id number]     DATE OF RECORDING: 12/08/2019 [facility] REFERRING M.D.:  Glendale Chard, MD Study Performed: CPAP  Titration HISTORY: The Patient is returning for a PAP Titration sleep study following a Baseline PSG from 11/04/19.   1.  The patient was diagnosed with mild Obstructive Sleep Apnea (OSA) at AHI 9.5/h with strong REM sleep dependence, The REM AHI was 35.7/h.  2. REM sleep related sleep talking was recorded.  3. No prolonged hypoxemia during sleep- this makes correlation to any headache disorder difficult.    The patient endorsed the Epworth Sleepiness Scale at 15/24 points and the Fatigue Score at --- points.   The patient's weight 205 pounds with a height of 63 (inches), resulting in a BMI of 36.3 kg/m2. The patient's neck circumference measured 17.5 inches.  CURRENT MEDICATIONS: Ventolin, Vitamin C, Vitamin D, Flexeril, Xyzal.    PROCEDURE:  This is a multichannel digital polysomnogram utilizing the SomnoStar 11.2 system.  Electrodes and sensors were applied and monitored per AASM Specifications.   EEG, EOG, Chin and Limb EMG, were sampled at 200 Hz.  ECG, Snore and Nasal Pressure, Thermal Airflow, Respiratory Effort, CPAP Flow and Pressure, Oximetry was sampled at 50 Hz. Digital video and audio were recorded.       The patient was fitted with a Respironics Dreamwear medium full- face mask.  CPAP was initiated at 5 cmH20 with heated humidity per AASM split night standards and pressure was advanced to 11 cmH20 because of hypopneas, apneas and desaturations.  At a PAP pressure of 11 cmH20, there was an incomplete result- a reduction of the AHI to 2.7/h with improvement of sleep apnea. Total sleep time was 22 minutes.   Lights Out was at 21:27 and Lights On at 05:14. Total recording time (TRT) was 467.5 minutes, with a total sleep time (TST) of 412 minutes. The patient's sleep latency was 21.5 minutes. REM latency was 61  minutes.  The sleep efficiency was 88.1 %.    SLEEP ARCHITECTURE: WASO (Wake after sleep onset) was 33.5 minutes.  There were 5 minutes in Stage N1, 256 minutes Stage N2, 54.5 minutes Stage N3 and 96.5 minutes in Stage REM.  The percentage of Stage N1 was 1.2%, Stage N2 was 62.1%, Stage N3 was 13.2% and Stage R (REM sleep) was 23.4%.    RESPIRATORY ANALYSIS:  There was a total of 11 respiratory events: 3 obstructive apneas, 6 central apneas and 0 mixed apneas with a total of 9 apneas and an apnea index (AHI) of 1.3 /hour. There were 2 hypopneas with a hypopnea index of .3/hour.   The total APNEA/HYPOPNEA INDEX  (AHI) was 1.6 /hour and the total RESPIRATORY DISTURBANCE INDEX was 1.6 /hour  8 events occurred in REM sleep and 3 events in NREM. The REM AHI was 5. /hour versus a non-REM AHI of 0.6 /hour.  The patient spent 248.5 minutes of total sleep time in the supine position and 164 minutes in non-supine. The supine AHI was 1.4, versus a non-supine AHI of 1.9.  OXYGEN SATURATION & C02:  The baseline 02 saturation was 98%, with the lowest being 91%. Time spent below 89% saturation equaled 0 minutes.  The arousals were noted as: 24 were spontaneous, 0 were associated with PLMs, 1 was associated with respiratory events. The patient had a total of 0 Periodic Limb Movements.   Audio and video analysis  did not show any abnormal or unusual movements, behaviors, phonations or vocalizations.   EKG was in keeping with normal sinus rhythm.  DIAGNOSIS Central Sleep Apnea was significantly reduced under CPAP pressure of 11 cm water and showed residual obstructive events, especially in supine sleep. The patient was fitted with a Respironics Dreamwear medium full- face mask.   PLANS/RECOMMENDATIONS: autotitration CPAP device with heated humidification and a setting of 5-15 cm water, 3 cm EPR and a Respironics Dreamwear medium full- face mask.   DISCUSSION: A follow up appointment will be scheduled with our NP  in the Sleep Clinic at [facility].   Please call [phone] with any questions.      I certify that I have reviewed the entire raw data recording prior to the issuance of this report in accordance with the Standards of Accreditation of the American Academy of Sleep Medicine (AASM)  Larey Seat, M.D. Diplomat, Tax adviser of Psychiatry and Neurology  Diplomat, Tax adviser of Sleep Medicine Market researcher, [employer] [facility] at [employer]

## 2019-12-18 NOTE — Addendum Note (Signed)
Addended by: Larey Seat on: 12/18/2019 05:43 PM   Modules accepted: Orders

## 2019-12-18 NOTE — Progress Notes (Signed)
DIAGNOSIS  Obstructive Sleep Apnea was significantly reduced under CPAP pressure  of 11 cm water and showed residual obstructive events, especially  in supine sleep. The patient was fitted with a Respironics  Dreamwear medium full- face mask.    PLANS/RECOMMENDATIONS: autotitration CPAP device with heated  humidification and a setting of 5-15 cm water, 3 cm EPR and a  Respironics Dreamwear medium full- face mask.   DISCUSSION: A follow up appointment will be scheduled with our  NP in the Sleep Clinic at [facility].   Please call [phone] with any questions.

## 2019-12-22 ENCOUNTER — Telehealth: Payer: Self-pay

## 2019-12-22 NOTE — Telephone Encounter (Signed)
I called pt. I advised pt that Dr. Brett Fairy reviewed their sleep study results and found that pt did well with the cpap. Dr. Brett Fairy recommends that pt start a cpap at home. I reviewed PAP compliance expectations with the pt. Pt is agreeable to starting a CPAP. I advised pt that an order will be sent to a DME, [facility], and [facility] will call the pt within about one week after they file with the pt's insurance. [facility] will show the pt how to use the machine, fit for masks, and troubleshoot the CPAP if needed. A follow up appt was made for insurance purposes with Amy, NP on 03/16/2020 at 7:30am. Pt verbalized understanding to arrive 15 minutes early and bring their CPAP. A letter with all of this information in it will be mailed to the pt as a reminder. I verified with the pt that the address we have on file is correct. Pt verbalized understanding of results. Pt had no questions at this time but was encouraged to call back if questions arise. I have sent the order to [facility] and have received confirmation that they have received the order.

## 2019-12-22 NOTE — Telephone Encounter (Signed)
-----   Message from Larey Seat, MD sent at 12/18/2019  5:43 PM EDT ----- DIAGNOSIS  Obstructive Sleep Apnea was significantly reduced under CPAP pressure  of 11 cm water and showed residual obstructive events, especially  in supine sleep. The patient was fitted with a Respironics  Dreamwear medium full- face mask.    PLANS/RECOMMENDATIONS: autotitration CPAP device with heated  humidification and a setting of 5-15 cm water, 3 cm EPR and a  Respironics Dreamwear medium full- face mask.   DISCUSSION: A follow up appointment will be scheduled with our  NP in the Sleep Clinic at [facility].   Please call [phone] with any questions.

## 2019-12-23 ENCOUNTER — Encounter: Payer: Self-pay | Admitting: Neurology

## 2019-12-23 DIAGNOSIS — U071 COVID-19: Secondary | ICD-10-CM | POA: Insufficient documentation

## 2020-01-01 ENCOUNTER — Encounter: Payer: Self-pay | Admitting: Family Medicine

## 2020-01-16 ENCOUNTER — Ambulatory Visit: Payer: 59 | Attending: Internal Medicine

## 2020-01-16 DIAGNOSIS — Z23 Encounter for immunization: Secondary | ICD-10-CM

## 2020-01-16 NOTE — Progress Notes (Signed)
   Covid-19 Vaccination Clinic  Name:  [name]    MRN: [id number] DOB: Feb 04, 1976  01/16/2020  [name] was observed post Covid-19 immunization for 15 minutes without incident. She was provided with Vaccine Information Sheet and instruction to access the V-Safe system.   [name] was instructed to call 911 with any severe reactions post vaccine: Marland Kitchen Difficulty breathing  . Swelling of face and throat  . A fast heartbeat  . A bad rash all over body  . Dizziness and weakness

## 2020-02-11 ENCOUNTER — Encounter: Payer: Self-pay | Admitting: Internal Medicine

## 2020-02-11 ENCOUNTER — Other Ambulatory Visit: Payer: Self-pay

## 2020-02-11 MED ORDER — ALBUTEROL SULFATE HFA 108 (90 BASE) MCG/ACT IN AERS: 2.0000 | INHALATION_SPRAY | Freq: Four times a day (QID) | RESPIRATORY_TRACT | 1 refills | Status: DC | PRN

## 2020-03-15 ENCOUNTER — Other Ambulatory Visit: Payer: Self-pay

## 2020-03-15 ENCOUNTER — Ambulatory Visit (INDEPENDENT_AMBULATORY_CARE_PROVIDER_SITE_OTHER): Payer: 59 | Admitting: Internal Medicine

## 2020-03-15 ENCOUNTER — Encounter: Payer: Self-pay | Admitting: Internal Medicine

## 2020-03-15 VITALS — BP 162/84 | HR 102 | Temp 98.4°F | Ht 63.0 in | Wt 207.8 lb

## 2020-03-15 DIAGNOSIS — R7309 Other abnormal glucose: Secondary | ICD-10-CM | POA: Diagnosis not present

## 2020-03-15 DIAGNOSIS — I10 Essential (primary) hypertension: Secondary | ICD-10-CM | POA: Diagnosis not present

## 2020-03-15 DIAGNOSIS — Z0000 Encounter for general adult medical examination without abnormal findings: Secondary | ICD-10-CM | POA: Diagnosis not present

## 2020-03-15 LAB — POCT URINALYSIS DIPSTICK
Bilirubin, UA: NEGATIVE
Blood, UA: NEGATIVE
Glucose, UA: NEGATIVE
Ketones, UA: NEGATIVE
Leukocytes, UA: NEGATIVE
Nitrite, UA: NEGATIVE
Protein, UA: NEGATIVE
Spec Grav, UA: 1.02 (ref 1.010–1.025)
Urobilinogen, UA: 0.2 E.U./dL
pH, UA: 6 (ref 5.0–8.0)

## 2020-03-15 LAB — POCT UA - MICROALBUMIN
Albumin/Creatinine Ratio, Urine, POC: 30
Creatinine, POC: 50 mg/dL
Microalbumin Ur, POC: 10 mg/L

## 2020-03-15 NOTE — Patient Instructions (Addendum)
Health Maintenance, Female Adopting a healthy lifestyle and getting preventive care are important in promoting health and wellness. Ask your health care provider about:  The right schedule for you to have regular tests and exams.  Things you can do on your own to prevent diseases and keep yourself healthy. What should I know about diet, weight, and exercise? Eat a healthy diet   Eat a diet that includes plenty of vegetables, fruits, low-fat dairy products, and lean protein.  Do not eat a lot of foods that are high in solid fats, added sugars, or sodium. Maintain a healthy weight Body mass index (BMI) is used to identify weight problems. It estimates body fat based on height and weight. Your health care provider can help determine your BMI and help you achieve or maintain a healthy weight. Get regular exercise Get regular exercise. This is one of the most important things you can do for your health. Most adults should:  Exercise for at least 150 minutes each week. The exercise should increase your heart rate and make you sweat (moderate-intensity exercise).  Do strengthening exercises at least twice a week. This is in addition to the moderate-intensity exercise.  Spend less time sitting. Even light physical activity can be beneficial. Watch cholesterol and blood lipids Have your blood tested for lipids and cholesterol at 44 years of age, then have this test every 5 years. Have your cholesterol levels checked more often if:  Your lipid or cholesterol levels are high.  You are older than 44 years of age.  You are at high risk for heart disease. What should I know about cancer screening? Depending on your health history and family history, you may need to have cancer screening at various ages. This may include screening for:  Breast cancer.  Cervical cancer.  Colorectal cancer.  Skin cancer.  Lung cancer. What should I know about heart disease, diabetes, and high blood  pressure? Blood pressure and heart disease  High blood pressure causes heart disease and increases the risk of stroke. This is more likely to develop in people who have high blood pressure readings, are of African descent, or are overweight.  Have your blood pressure checked: ? Every 3-5 years if you are 18-39 years of age. ? Every year if you are 40 years old or older. Diabetes Have regular diabetes screenings. This checks your fasting blood sugar level. Have the screening done:  Once every three years after age 40 if you are at a normal weight and have a low risk for diabetes.  More often and at a younger age if you are overweight or have a high risk for diabetes. What should I know about preventing infection? Hepatitis B If you have a higher risk for hepatitis B, you should be screened for this virus. Talk with your health care provider to find out if you are at risk for hepatitis B infection. Hepatitis C Testing is recommended for:  Everyone born from 1945 through 1965.  Anyone with known risk factors for hepatitis C. Sexually transmitted infections (STIs)  Get screened for STIs, including gonorrhea and chlamydia, if: ? You are sexually active and are younger than 44 years of age. ? You are older than 44 years of age and your health care provider tells you that you are at risk for this type of infection. ? Your sexual activity has changed since you were last screened, and you are at increased risk for chlamydia or gonorrhea. Ask your health care provider if   you are at risk.  Ask your health care provider about whether you are at high risk for HIV. Your health care provider may recommend a prescription medicine to help prevent HIV infection. If you choose to take medicine to prevent HIV, you should first get tested for HIV. You should then be tested every 3 months for as long as you are taking the medicine. Pregnancy  If you are about to stop having your period (premenopausal) and  you may become pregnant, seek counseling before you get pregnant.  Take 400 to 800 micrograms (mcg) of folic acid every day if you become pregnant.  Ask for birth control (contraception) if you want to prevent pregnancy. Osteoporosis and menopause Osteoporosis is a disease in which the bones lose minerals and strength with aging. This can result in bone fractures. If you are 29 years old or older, or if you are at risk for osteoporosis and fractures, ask your health care provider if you should:  Be screened for bone loss.  Take a calcium or vitamin D supplement to lower your risk of fractures.  Be given hormone replacement therapy (HRT) to treat symptoms of menopause. Follow these instructions at home: Lifestyle  Do not use any products that contain nicotine or tobacco, such as cigarettes, e-cigarettes, and chewing tobacco. If you need help quitting, ask your health care provider.  Do not use street drugs.  Do not share needles.  Ask your health care provider for help if you need support or information about quitting drugs. Alcohol use  Do not drink alcohol if: ? Your health care provider tells you not to drink. ? You are pregnant, may be pregnant, or are planning to become pregnant.  If you drink alcohol: ? Limit how much you use to 0-1 drink a day. ? Limit intake if you are breastfeeding.  Be aware of how much alcohol is in your drink. In the [place] one drink equals one 12 oz bottle of beer (355 mL), one 5 oz glass of wine (148 mL), or one 1 oz glass of hard liquor (44 mL). General instructions  Schedule regular health, dental, and eye exams.  Stay current with your vaccines.  Tell your health care provider if: ? You often feel depressed. ? You have ever been abused or do not feel safe at home. Summary  Adopting a healthy lifestyle and getting preventive care are important in promoting health and wellness.  Follow your health care provider's instructions about healthy  diet, exercising, and getting tested or screened for diseases.  Follow your health care provider's instructions on monitoring your cholesterol and blood pressure. This information is not intended to replace advice given to you by your health care provider. Make sure you discuss any questions you have with your health care provider. Document Revised: 03/20/2018 Document Reviewed: 03/20/2018 Elsevier Patient Education  [place] DASH stands for "Dietary Approaches to Stop Hypertension." The DASH eating plan is a healthy eating plan that has been shown to reduce high blood pressure (hypertension). It may also reduce your risk for type 2 diabetes, heart disease, and stroke. The DASH eating plan may also help with weight loss. What are tips for following this plan?  General guidelines  Avoid eating more than 2,300 mg (milligrams) of salt (sodium) a day. If you have hypertension, you may need to reduce your sodium intake to 1,500 mg a day.  Limit alcohol intake to no more than 1 drink a day for  nonpregnant women and 2 drinks a day for men. One drink equals 12 oz of beer, 5 oz of wine, or 1 oz of hard liquor.  Work with your health care provider to maintain a healthy body weight or to lose weight. Ask what an ideal weight is for you.  Get at least 30 minutes of exercise that causes your heart to beat faster (aerobic exercise) most days of the week. Activities may include walking, swimming, or biking.  Work with your health care provider or diet and nutrition specialist (dietitian) to adjust your eating plan to your individual calorie needs. Reading food labels   Check food labels for the amount of sodium per serving. Choose foods with less than 5 percent of the Daily Value of sodium. Generally, foods with less than 300 mg of sodium per serving fit into this eating plan.  To find whole grains, look for the word "whole" as the first word in the ingredient  list. Shopping  Buy products labeled as "low-sodium" or "no salt added."  Buy fresh foods. Avoid canned foods and premade or frozen meals. Cooking  Avoid adding salt when cooking. Use salt-free seasonings or herbs instead of table salt or sea salt. Check with your health care provider or pharmacist before using salt substitutes.  Do not fry foods. Cook foods using healthy methods such as baking, boiling, grilling, and broiling instead.  Cook with heart-healthy oils, such as olive, canola, soybean, or sunflower oil. Meal planning  Eat a balanced diet that includes: ? 5 or more servings of fruits and vegetables each day. At each meal, try to fill half of your plate with fruits and vegetables. ? Up to 6-8 servings of whole grains each day. ? Less than 6 oz of lean meat, poultry, or fish each day. A 3-oz serving of meat is about the same size as a deck of cards. One egg equals 1 oz. ? 2 servings of low-fat dairy each day. ? A serving of nuts, seeds, or beans 5 times each week. ? Heart-healthy fats. Healthy fats called Omega-3 fatty acids are found in foods such as flaxseeds and coldwater fish, like sardines, salmon, and mackerel.  Limit how much you eat of the following: ? Canned or prepackaged foods. ? Food that is high in trans fat, such as fried foods. ? Food that is high in saturated fat, such as fatty meat. ? Sweets, desserts, sugary drinks, and other foods with added sugar. ? Full-fat dairy products.  Do not salt foods before eating.  Try to eat at least 2 vegetarian meals each week.  Eat more home-cooked food and less restaurant, buffet, and fast food.  When eating at a restaurant, ask that your food be prepared with less salt or no salt, if possible. What foods are recommended? The items listed may not be a complete list. Talk with your dietitian about what dietary choices are best for you. Grains Whole-grain or whole-wheat bread. Whole-grain or whole-wheat pasta. Brown  rice. [place]. Bulgur. Whole-grain and low-sodium cereals. Pita bread. Low-fat, low-sodium crackers. Whole-wheat flour tortillas. Vegetables Fresh or frozen vegetables (raw, steamed, roasted, or grilled). Low-sodium or reduced-sodium tomato and vegetable juice. Low-sodium or reduced-sodium tomato sauce and tomato paste. Low-sodium or reduced-sodium canned vegetables. Fruits All fresh, dried, or frozen fruit. Canned fruit in natural juice (without added sugar). Meat and other protein foods Skinless chicken or [place]. Ground chicken or [place]. Pork with fat trimmed off. Fish and seafood. Egg whites. Dried beans, peas, or lentils.  Unsalted nuts, nut butters, and seeds. Unsalted canned beans. Lean cuts of beef with fat trimmed off. Low-sodium, lean deli meat. Dairy Low-fat (1%) or fat-free (skim) milk. Fat-free, low-fat, or reduced-fat cheeses. Nonfat, low-sodium ricotta or cottage cheese. Low-fat or nonfat yogurt. Low-fat, low-sodium cheese. Fats and oils Soft margarine without trans fats. Vegetable oil. Low-fat, reduced-fat, or light mayonnaise and salad dressings (reduced-sodium). Canola, safflower, olive, soybean, and sunflower oils. Avocado. Seasoning and other foods Herbs. Spices. Seasoning mixes without salt. Unsalted popcorn and pretzels. Fat-free sweets. What foods are not recommended? The items listed may not be a complete list. Talk with your dietitian about what dietary choices are best for you. Grains Baked goods made with fat, such as croissants, muffins, or some breads. Dry pasta or rice meal packs. Vegetables Creamed or fried vegetables. Vegetables in a cheese sauce. Regular canned vegetables (not low-sodium or reduced-sodium). Regular canned tomato sauce and paste (not low-sodium or reduced-sodium). Regular tomato and vegetable juice (not low-sodium or reduced-sodium). Angie Fava. Olives. Fruits Canned fruit in a light or heavy syrup. Fried fruit. Fruit in cream or butter  sauce. Meat and other protein foods Fatty cuts of meat. Ribs. Fried meat. Berniece Salines. Sausage. Bologna and other processed lunch meats. Salami. Fatback. Hotdogs. Bratwurst. Salted nuts and seeds. Canned beans with added salt. Canned or smoked fish. Whole eggs or egg yolks. Chicken or [place] with skin. Dairy Whole or 2% milk, cream, and half-and-half. Whole or full-fat cream cheese. Whole-fat or sweetened yogurt. Full-fat cheese. Nondairy creamers. Whipped toppings. Processed cheese and cheese spreads. Fats and oils Butter. Stick margarine. Lard. Shortening. Ghee. Bacon fat. Tropical oils, such as coconut, palm kernel, or palm oil. Seasoning and other foods Salted popcorn and pretzels. Onion salt, garlic salt, seasoned salt, table salt, and sea salt. Worcestershire sauce. Tartar sauce. Barbecue sauce. Teriyaki sauce. Soy sauce, including reduced-sodium. Steak sauce. Canned and packaged gravies. Fish sauce. Oyster sauce. Cocktail sauce. Horseradish that you find on the shelf. Ketchup. Mustard. Meat flavorings and tenderizers. Bouillon cubes. Hot sauce and Tabasco sauce. Premade or packaged marinades. Premade or packaged taco seasonings. Relishes. Regular salad dressings. Where to find more information:  National Heart, Lung, and [place]: https://wilson-eaton.com/  American Heart Association: www.heart.org Summary  The DASH eating plan is a healthy eating plan that has been shown to reduce high blood pressure (hypertension). It may also reduce your risk for type 2 diabetes, heart disease, and stroke.  With the DASH eating plan, you should limit salt (sodium) intake to 2,300 mg a day. If you have hypertension, you may need to reduce your sodium intake to 1,500 mg a day.  When on the DASH eating plan, aim to eat more fresh fruits and vegetables, whole grains, lean proteins, low-fat dairy, and heart-healthy fats.  Work with your health care provider or diet and nutrition specialist (dietitian) to adjust  your eating plan to your individual calorie needs. This information is not intended to replace advice given to you by your health care provider. Make sure you discuss any questions you have with your health care provider. Document Revised: 03/09/2017 Document Reviewed: 03/20/2016 Elsevier Patient Education  2020 [employer].

## 2020-03-15 NOTE — Progress Notes (Signed)
I,Tianna Badgett,acting as a Education administrator for [name], MD.,have documented all relevant documentation on the behalf of [name], MD,as directed by  [name], MD while in the presence of [name], MD.   This visit occurred during the SARS-CoV-2 public health emergency.  Safety protocols were in place, including screening questions prior to the visit, additional usage of staff PPE, and extensive cleaning of exam room while observing appropriate contact time as indicated for disinfecting solutions.  Subjective:     Patient ID: [name] , female    DOB: 06/20/1975 , 44 y.o.   MRN: [id number]   Chief Complaint  Patient presents with  . Annual Exam    HPI  She is here today for a full physical examination. She is followed by Dr. Garwin Brothers for her GYN exams.  She has no specific concerns or complaints at this time.     Past Medical History:  Diagnosis Date  . Fibroids      Family History  Problem Relation Age of Onset  . Hypertension Mother   . Hyperthyroidism Mother   . Prostate cancer Maternal Grandfather      Current Outpatient Medications:  .  albuterol (VENTOLIN HFA) 108 (90 Base) MCG/ACT inhaler, Inhale 2 puffs into the lungs every 6 (six) hours as needed for wheezing or shortness of breath., Disp: 18 g, Rfl: 1 .  Ascorbic Acid (VITAMIN C PO), Take by mouth daily., Disp: , Rfl:  .  Cholecalciferol (VITAMIN D PO), Take by mouth daily., Disp: , Rfl:  .  cyclobenzaprine (FLEXERIL) 10 MG tablet, Take 1 tablet (10 mg total) by mouth 3 (three) times daily as needed for muscle spasms., Disp: 30 tablet, Rfl: 0 .  levocetirizine (XYZAL) 5 MG tablet, TAKE 1 TABLET BY MOUTH EVERY DAY IN THE EVENING, Disp: 90 tablet, Rfl: 1 .  Multiple Vitamin (MULTIVITAMIN) tablet, Take 1 tablet by mouth daily., Disp: , Rfl:  .  VITAMIN E PO, Take by mouth daily., Disp: , Rfl:  .  amLODipine (NORVASC) 5 MG tablet, Take 1 tablet (5 mg total) by mouth at bedtime., Disp: 30  tablet, Rfl: 1   No Known Allergies    The patient states she uses none for birth control. Last LMP was Patient's last menstrual period was 02/24/2020.. Negative for Dysmenorrhea. Negative for: breast discharge, breast lump(s), breast pain and breast self exam. Associated symptoms include abnormal vaginal bleeding. Pertinent negatives include abnormal bleeding (hematology), anxiety, decreased libido, depression, difficulty falling sleep, dyspareunia, history of infertility, nocturia, sexual dysfunction, sleep disturbances, urinary incontinence, urinary urgency, vaginal discharge and vaginal itching. Diet regular.The patient states her exercise level is  minimal.   . The patient's tobacco use is:  Social History   Tobacco Use  Smoking Status Never Smoker  Smokeless Tobacco Never Used  . She has been exposed to passive smoke. The patient's alcohol use is:  Social History   Substance and Sexual Activity  Alcohol Use Yes  . Alcohol/week: 2.0 standard drinks  . Types: 2 Glasses of wine per week      Review of Systems  Constitutional: Negative.   HENT: Negative.   Eyes: Negative.   Respiratory: Negative.   Cardiovascular: Negative.   Gastrointestinal: Negative.   Endocrine: Negative.   Genitourinary: Negative.   Musculoskeletal: Negative.   Skin: Negative.   Allergic/Immunologic: Negative.   Neurological: Negative.   Hematological: Negative.   Psychiatric/Behavioral: Negative.      Today's Vitals   03/15/20 1018  BP: (!) 162/84  Pulse: (!) 102  Temp: 98.4 F (36.9 C)  TempSrc: Oral  Weight: 207 lb 12.8 oz (94.3 kg)  Height: _0  (1.6 m)   Body mass index is 36.81 kg/m.   BP Readings from Last 3 Encounters:  03/15/20 (!) 162/84  10/08/19 (!) 158/86  09/30/19 128/76   Wt Readings from Last 3 Encounters:  03/15/20 207 lb 12.8 oz (94.3 kg)  10/08/19 205 lb (93 kg)  09/30/19 204 lb 3.2 oz (92.6 kg)    Objective:  Physical Exam Vitals and nursing note reviewed.   Constitutional:      Appearance: Normal appearance. She is obese.  HENT:     Head: Normocephalic and atraumatic.     Right Ear: Tympanic membrane, ear canal and external ear normal.     Left Ear: Tympanic membrane, ear canal and external ear normal.     Nose:     Comments: Deferred, masked    Mouth/Throat:     Comments: Deferred, masked Eyes:     Extraocular Movements: Extraocular movements intact.     Conjunctiva/sclera: Conjunctivae normal.     Pupils: Pupils are equal, round, and reactive to light.  Cardiovascular:     Rate and Rhythm: Normal rate and regular rhythm.     Pulses: Normal pulses.     Heart sounds: Normal heart sounds.  Pulmonary:     Effort: Pulmonary effort is normal.     Breath sounds: Normal breath sounds.  Chest:  Breasts:     Tanner Score is 5.     Right: Normal.     Left: Normal.    Abdominal:     General: Bowel sounds are normal.     Palpations: Abdomen is soft.  Genitourinary:    Comments: deferred Musculoskeletal:        General: Normal range of motion.     Cervical back: Normal range of motion and neck supple.  Skin:    General: Skin is warm and dry.  Neurological:     General: No focal deficit present.     Mental Status: She is alert and oriented to person, place, and time.  Psychiatric:        Mood and Affect: Mood normal.        Behavior: Behavior normal.         Assessment And Plan:     1. Routine general medical examination at health care facility Comments: A full exam was performed. Importance of monthly self breast exams was discussed with the patient. PATIENT IS ADVISED TO GET 30-45 MINUTES REGULAR EXERCISE NO LESS THAN FOUR TO FIVE DAYS PER WEEK - BOTH WEIGHTBEARING EXERCISES AND AEROBIC ARE RECOMMENDED.  PATIENT IS ADVISED TO FOLLOW A HEALTHY DIET WITH AT LEAST SIX FRUITS/VEGGIES PER DAY, DECREASE INTAKE OF RED MEAT, AND TO INCREASE FISH INTAKE TO TWO DAYS PER WEEK.  MEATS/FISH SHOULD NOT BE FRIED, BAKED OR BROILED IS  PREFERABLE.  I SUGGEST WEARING SPF 50 SUNSCREEN ON EXPOSED PARTS AND ESPECIALLY WHEN IN THE DIRECT SUNLIGHT FOR AN EXTENDED PERIOD OF TIME.  PLEASE AVOID FAST FOOD RESTAURANTS AND INCREASE YOUR WATER INTAKE.  - CBC - CMP14+EGFR - Lipid panel - Hepatitis C antibody  2. Benign hypertension Comments: New onset. I will start her on amlodipine, 1m nightly. Possible side effects were discussed with the patient in detail. EKG performed, NSR w/o acute changes. She was given information on DASH eating plan. Encouraged to follow low sodium diet. She will f/u in 4-6  weeks for re-evaluation. - EKG 12-Lead - POCT UA - Microalbumin - POCT Urinalysis Dipstick (81002)  3. Other abnormal glucose Comments: Her a1c has been elevated in the past, I will recheck this today. Encouraged to avoid sugary beverages, including diet drinks.  - Hemoglobin A1c     Patient was given opportunity to ask questions. Patient verbalized understanding of the plan and was able to repeat key elements of the plan. All questions were answered to their satisfaction.   [name], MD   I, [name], MD, have reviewed all documentation for this visit. The documentation on 03/21/20 for the exam, diagnosis, procedures, and orders are all accurate and complete.  THE PATIENT IS ENCOURAGED TO PRACTICE SOCIAL DISTANCING DUE TO THE COVID-19 PANDEMIC.

## 2020-03-16 ENCOUNTER — Encounter: Payer: Self-pay | Admitting: Internal Medicine

## 2020-03-16 ENCOUNTER — Other Ambulatory Visit: Payer: Self-pay

## 2020-03-16 ENCOUNTER — Ambulatory Visit: Payer: Self-pay | Admitting: Family Medicine

## 2020-03-16 LAB — CMP14+EGFR
ALT: 15 IU/L (ref 0–32)
AST: 17 IU/L (ref 0–40)
Albumin/Globulin Ratio: 1.4 (ref 1.2–2.2)
Albumin: 4.6 g/dL (ref 3.8–4.8)
Alkaline Phosphatase: 89 IU/L (ref 44–121)
BUN/Creatinine Ratio: 11 (ref 9–23)
BUN: 9 mg/dL (ref 6–24)
Bilirubin Total: 0.3 mg/dL (ref 0.0–1.2)
CO2: 19 mmol/L — ABNORMAL LOW (ref 20–29)
Calcium: 9.1 mg/dL (ref 8.7–10.2)
Chloride: 105 mmol/L (ref 96–106)
Creatinine, Ser: 0.83 mg/dL (ref 0.57–1.00)
GFR calc Af Amer: 99 mL/min/{1.73_m2} (ref >59)
GFR calc non Af Amer: 86 mL/min/{1.73_m2} (ref >59)
Globulin, Total: 3.4 g/dL (ref 1.5–4.5)
Glucose: 83 mg/dL (ref 65–99)
Potassium: 4.1 mmol/L (ref 3.5–5.2)
Sodium: 138 mmol/L (ref 134–144)
Total Protein: 8 g/dL (ref 6.0–8.5)

## 2020-03-16 LAB — LIPID PANEL
Chol/HDL Ratio: 3.5 ratio (ref 0.0–4.4)
Cholesterol, Total: 150 mg/dL (ref 100–199)
HDL: 43 mg/dL (ref >39)
LDL Chol Calc (NIH): 91 mg/dL (ref 0–99)
Triglycerides: 83 mg/dL (ref 0–149)
VLDL Cholesterol Cal: 16 mg/dL (ref 5–40)

## 2020-03-16 LAB — CBC
Hematocrit: 32.8 % — ABNORMAL LOW (ref 34.0–46.6)
Hemoglobin: 9.8 g/dL — ABNORMAL LOW (ref 11.1–15.9)
MCH: 20.2 pg — ABNORMAL LOW (ref 26.6–33.0)
MCHC: 29.9 g/dL — ABNORMAL LOW (ref 31.5–35.7)
MCV: 68 fL — ABNORMAL LOW (ref 79–97)
Platelets: 367 10*3/uL (ref 150–450)
RBC: 4.85 x10E6/uL (ref 3.77–5.28)
RDW: 17.1 % — ABNORMAL HIGH (ref 11.7–15.4)
WBC: 6 10*3/uL (ref 3.4–10.8)

## 2020-03-16 LAB — HEMOGLOBIN A1C
Est. average glucose Bld gHb Est-mCnc: 123 mg/dL
Hgb A1c MFr Bld: 5.9 % — ABNORMAL HIGH (ref 4.8–5.6)

## 2020-03-16 LAB — HEPATITIS C ANTIBODY: Hep C Virus Ab: <0.1 s/co ratio (ref 0.0–0.9)

## 2020-03-16 MED ORDER — AMLODIPINE BESYLATE 5 MG PO TABS: 5.0000 mg | ORAL_TABLET | Freq: Every day | ORAL | 1 refills | Status: DC

## 2020-03-20 LAB — IRON AND TIBC
Iron Saturation: 4 % — CL (ref 15–55)
Iron: 20 ug/dL — ABNORMAL LOW (ref 27–159)
Total Iron Binding Capacity: 447 ug/dL (ref 250–450)
UIBC: 427 ug/dL — ABNORMAL HIGH (ref 131–425)

## 2020-03-20 LAB — SPECIMEN STATUS REPORT

## 2020-03-20 LAB — FERRITIN: Ferritin: 5 ng/mL — ABNORMAL LOW (ref 15–150)

## 2020-03-21 ENCOUNTER — Encounter: Payer: Self-pay | Admitting: Internal Medicine

## 2020-03-30 ENCOUNTER — Other Ambulatory Visit: Payer: Self-pay

## 2020-03-30 ENCOUNTER — Ambulatory Visit: Payer: 59

## 2020-03-30 VITALS — BP 132/70 | HR 84 | Temp 98.5°F | Ht 63.0 in | Wt 208.8 lb

## 2020-03-30 DIAGNOSIS — I10 Essential (primary) hypertension: Secondary | ICD-10-CM

## 2020-03-30 NOTE — Progress Notes (Signed)
Patient is here for blood pressure check.   BP Readings from Last 3 Encounters:  03/30/20 132/70  03/15/20 (!) 162/84  10/08/19 (!) 158/86

## 2020-04-05 ENCOUNTER — Other Ambulatory Visit: Payer: Self-pay | Admitting: Internal Medicine

## 2020-04-13 ENCOUNTER — Ambulatory Visit: Payer: Self-pay | Admitting: Family Medicine

## 2020-04-27 ENCOUNTER — Ambulatory Visit: Payer: 59 | Admitting: Internal Medicine

## 2020-05-11 ENCOUNTER — Ambulatory Visit: Payer: 59 | Admitting: Internal Medicine

## 2020-05-17 ENCOUNTER — Other Ambulatory Visit: Payer: Self-pay | Admitting: Internal Medicine

## 2020-06-02 ENCOUNTER — Ambulatory Visit (INDEPENDENT_AMBULATORY_CARE_PROVIDER_SITE_OTHER): Payer: 59 | Admitting: Internal Medicine

## 2020-06-02 ENCOUNTER — Other Ambulatory Visit: Payer: Self-pay

## 2020-06-02 ENCOUNTER — Telehealth: Payer: Self-pay | Admitting: Neurology

## 2020-06-02 ENCOUNTER — Encounter: Payer: Self-pay | Admitting: Internal Medicine

## 2020-06-02 VITALS — BP 144/72 | HR 99 | Temp 98.3°F | Ht 63.0 in | Wt 207.0 lb

## 2020-06-02 DIAGNOSIS — F419 Anxiety disorder, unspecified: Secondary | ICD-10-CM | POA: Diagnosis not present

## 2020-06-02 DIAGNOSIS — G4733 Obstructive sleep apnea (adult) (pediatric): Secondary | ICD-10-CM

## 2020-06-02 DIAGNOSIS — G933 Postviral fatigue syndrome: Secondary | ICD-10-CM

## 2020-06-02 DIAGNOSIS — I10 Essential (primary) hypertension: Secondary | ICD-10-CM

## 2020-06-02 DIAGNOSIS — Z6836 Body mass index (BMI) 36.0-36.9, adult: Secondary | ICD-10-CM

## 2020-06-02 DIAGNOSIS — R0683 Snoring: Secondary | ICD-10-CM

## 2020-06-02 DIAGNOSIS — U071 COVID-19: Secondary | ICD-10-CM

## 2020-06-02 DIAGNOSIS — G9331 Postviral fatigue syndrome: Secondary | ICD-10-CM

## 2020-06-02 DIAGNOSIS — E6609 Other obesity due to excess calories: Secondary | ICD-10-CM

## 2020-06-02 NOTE — Telephone Encounter (Signed)
Patient has not received CPAP yet, question was directed via PCP,

## 2020-06-02 NOTE — Progress Notes (Signed)
Rutherford Nail as a scribe for [name], MD.,have documented all relevant documentation on the behalf of [name], MD,as directed by  [name], MD while in the presence of [name], MD. This visit occurred during the SARS-CoV-2 public health emergency.  Safety protocols were in place, including screening questions prior to the visit, additional usage of staff PPE, and extensive cleaning of exam room while observing appropriate contact time as indicated for disinfecting solutions.  Subjective:     Patient ID: [name] , female    DOB: 12/30/1975 , 45 y.o.   MRN: [id number]   Chief Complaint  Patient presents with  . Hypertension    HPI  Patient is here today for blood pressure f/u. She has been compliant with her medications, and has no other concerns today.  Hypertension Pertinent negatives include no headaches.     Past Medical History:  Diagnosis Date  . Fibroids      Family History  Problem Relation Age of Onset  . Hypertension Mother   . Hyperthyroidism Mother   . Prostate cancer Maternal Grandfather      Current Outpatient Medications:  .  albuterol (VENTOLIN HFA) 108 (90 Base) MCG/ACT inhaler, INHALE 2 PUFFS INTO THE LUNGS EVERY 6 HOURS AS NEEDED FOR WHEEZING OR SHORTNESS OF BREATH, Disp: 18 g, Rfl: 1 .  amLODipine (NORVASC) 5 MG tablet, TAKE 1 TABLET(5 MG) BY MOUTH AT BEDTIME, Disp: 90 tablet, Rfl: 1 .  Ascorbic Acid (VITAMIN C PO), Take by mouth daily., Disp: , Rfl:  .  Cholecalciferol (VITAMIN D PO), Take by mouth daily., Disp: , Rfl:  .  cyclobenzaprine (FLEXERIL) 10 MG tablet, Take 1 tablet (10 mg total) by mouth 3 (three) times daily as needed for muscle spasms., Disp: 30 tablet, Rfl: 0 .  levocetirizine (XYZAL) 5 MG tablet, TAKE 1 TABLET BY MOUTH EVERY DAY IN THE EVENING, Disp: 90 tablet, Rfl: 1 .  Multiple Vitamin (MULTIVITAMIN) tablet, Take 1 tablet by mouth daily., Disp: , Rfl:  .  VITAMIN E PO, Take by mouth  daily., Disp: , Rfl:    No Known Allergies   Review of Systems  Constitutional: Negative.  Negative for fatigue.  HENT: Negative.   Endocrine: Negative for polydipsia, polyphagia and polyuria.  Musculoskeletal: Negative.   Skin: Negative.   Neurological: Negative for dizziness and headaches.  Psychiatric/Behavioral: Negative.      Today's Vitals   06/02/20 1606  BP: (!) 144/72  Pulse: 99  Temp: 98.3 F (36.8 C)  TempSrc: Oral  Weight: 207 lb (93.9 kg)  Height: 5\' 3"  (1.6 m)   Body mass index is 36.67 kg/m.  Wt Readings from Last 3 Encounters:  06/02/20 207 lb (93.9 kg)  03/30/20 208 lb 12.8 oz (94.7 kg)  03/15/20 207 lb 12.8 oz (94.3 kg)   BP Readings from Last 3 Encounters:  06/02/20 (!) 144/72  03/30/20 132/70  03/15/20 (!) 162/84     Objective:  Physical Exam Vitals reviewed.  Constitutional:      General: She is not in acute distress.    Appearance: Normal appearance. She is obese.  HENT:     Head: Normocephalic.     Right Ear: Tympanic membrane, ear canal and external ear normal. There is no impacted cerumen.     Left Ear: Tympanic membrane, ear canal and external ear normal. There is no impacted cerumen.     Nose: Nose normal. No congestion.     Mouth/Throat:  Mouth: Mucous membranes are moist.  Eyes:     Extraocular Movements: Extraocular movements intact.     Conjunctiva/sclera: Conjunctivae normal.     Pupils: Pupils are equal, round, and reactive to light.  Cardiovascular:     Rate and Rhythm: Normal rate and regular rhythm.     Pulses: Normal pulses.     Heart sounds: Normal heart sounds. No murmur heard.   Pulmonary:     Effort: Pulmonary effort is normal. No respiratory distress.     Breath sounds: Normal breath sounds. No wheezing.  Skin:    General: Skin is warm and dry.     Capillary Refill: Capillary refill takes less than 2 seconds.  Neurological:     General: No focal deficit present.     Mental Status: She is alert and  oriented to person, place, and time.     Cranial Nerves: No cranial nerve deficit.  Psychiatric:        Mood and Affect: Mood normal.        Behavior: Behavior normal.        Thought Content: Thought content normal.        Judgment: Judgment normal.         Assessment And Plan:     1. Essential hypertension Comments: Uncontrolled. I would like to increase dose of amlodipine to 5mg  daily. She will take nightly. She agrees to f/u in 6 weeks. Also advised to remove all seasonings w/ salt from her spice cabinet. I will make further recommendations at her next visit.   2. Anxiety Comments: She agrees to referral for therapy. She admits that she may have suppressed some emotions related to her divorce.  - Ambulatory referral to Psychology  3. Class 2 obesity due to excess calories without serious comorbidity with body mass index (BMI) of 36.0 to 36.9 in adult She is encouraged to strive for BMI less than 30 to decrease cardiac risk. Advised to aim for at least 150 minutes of exercise per week.  Patient was given opportunity to ask questions. Patient verbalized understanding of the plan and was able to repeat key elements of the plan. All questions were answered to their satisfaction.   I, [name], MD, have reviewed all documentation for this visit. The documentation on 06/02/20 for the exam, diagnosis, procedures, and orders are all accurate and complete.  THE PATIENT IS ENCOURAGED TO PRACTICE SOCIAL DISTANCING DUE TO THE COVID-19 PANDEMIC.

## 2020-06-02 NOTE — Patient Instructions (Addendum)
Magnesium glycinate -   Hypertension, Adult Hypertension is another name for high blood pressure. High blood pressure forces your heart to work harder to pump blood. This can cause problems over time. There are two numbers in a blood pressure reading. There is a top number (systolic) over a bottom number (diastolic). It is best to have a blood pressure that is below 120/80. Healthy choices can help lower your blood pressure, or you may need medicine to help lower it. What are the causes? The cause of this condition is not known. Some conditions may be related to high blood pressure. What increases the risk?  Smoking.  Having type 2 diabetes mellitus, high cholesterol, or both.  Not getting enough exercise or physical activity.  Being overweight.  Having too much fat, sugar, calories, or salt (sodium) in your diet.  Drinking too much alcohol.  Having long-term (chronic) kidney disease.  Having a family history of high blood pressure.  Age. Risk increases with age.  Race. You may be at higher risk if you are African American.  Gender. Men are at higher risk than women before age 53. After age 34, women are at higher risk than men.  Having obstructive sleep apnea.  Stress. What are the signs or symptoms?  High blood pressure may not cause symptoms. Very high blood pressure (hypertensive crisis) may cause: ? Headache. ? Feelings of worry or nervousness (anxiety). ? Shortness of breath. ? Nosebleed. ? A feeling of being sick to your stomach (nausea). ? Throwing up (vomiting). ? Changes in how you see. ? Very bad chest pain. ? Seizures. How is this treated?  This condition is treated by making healthy lifestyle changes, such as: ? Eating healthy foods. ? Exercising more. ? Drinking less alcohol.  Your health care provider may prescribe medicine if lifestyle changes are not enough to get your blood pressure under control, and if: ? Your top number is above 130. ? Your  bottom number is above 80.  Your personal target blood pressure may vary. Follow these instructions at home: Eating and drinking  If told, follow the DASH eating plan. To follow this plan: ? Fill one half of your plate at each meal with fruits and vegetables. ? Fill one fourth of your plate at each meal with whole grains. Whole grains include whole-wheat pasta, brown rice, and whole-grain bread. ? Eat or drink low-fat dairy products, such as skim milk or low-fat yogurt. ? Fill one fourth of your plate at each meal with low-fat (lean) proteins. Low-fat proteins include fish, chicken without skin, eggs, beans, and tofu. ? Avoid fatty meat, cured and processed meat, or chicken with skin. ? Avoid pre-made or processed food.  Eat less than 1,500 mg of salt each day.  Do not drink alcohol if: ? Your doctor tells you not to drink. ? You are pregnant, may be pregnant, or are planning to become pregnant.  If you drink alcohol: ? Limit how much you use to:  0-1 drink a day for women.  0-2 drinks a day for men. ? Be aware of how much alcohol is in your drink. In the [place] one drink equals one 12 oz bottle of beer (355 mL), one 5 oz glass of wine (148 mL), or one 1 oz glass of hard liquor (44 mL).   Lifestyle  Work with your doctor to stay at a healthy weight or to lose weight. Ask your doctor what the best weight is for you.  Get at least  30 minutes of exercise most days of the week. This may include walking, swimming, or biking.  Get at least 30 minutes of exercise that strengthens your muscles (resistance exercise) at least 3 days a week. This may include lifting weights or doing Pilates.  Do not use any products that contain nicotine or tobacco, such as cigarettes, e-cigarettes, and chewing tobacco. If you need help quitting, ask your doctor.  Check your blood pressure at home as told by your doctor.  Keep all follow-up visits as told by your doctor. This is important.    Medicines  Take over-the-counter and prescription medicines only as told by your doctor. Follow directions carefully.  Do not skip doses of blood pressure medicine. The medicine does not work as well if you skip doses. Skipping doses also puts you at risk for problems.  Ask your doctor about side effects or reactions to medicines that you should watch for. Contact a doctor if you:  Think you are having a reaction to the medicine you are taking.  Have headaches that keep coming back (recurring).  Feel dizzy.  Have swelling in your ankles.  Have trouble with your vision. Get help right away if you:  Get a very bad headache.  Start to feel mixed up (confused).  Feel weak or numb.  Feel faint.  Have very bad pain in your: ? Chest. ? Belly (abdomen).  Throw up more than once.  Have trouble breathing. Summary  Hypertension is another name for high blood pressure.  High blood pressure forces your heart to work harder to pump blood.  For most people, a normal blood pressure is less than 120/80.  Making healthy choices can help lower blood pressure. If your blood pressure does not get lower with healthy choices, you may need to take medicine. This information is not intended to replace advice given to you by your health care provider. Make sure you discuss any questions you have with your health care provider. Document Revised: 12/05/2017 Document Reviewed: 12/05/2017 Elsevier Patient Education  2021 [employer].

## 2020-06-03 ENCOUNTER — Encounter: Payer: Self-pay | Admitting: Internal Medicine

## 2020-07-15 ENCOUNTER — Encounter: Payer: Self-pay | Admitting: Internal Medicine

## 2020-07-15 ENCOUNTER — Other Ambulatory Visit: Payer: Self-pay

## 2020-07-15 MED ORDER — AMLODIPINE BESYLATE 5 MG PO TABS: ORAL_TABLET | ORAL | 1 refills | Status: DC

## 2020-07-16 ENCOUNTER — Other Ambulatory Visit: Payer: Self-pay

## 2020-07-16 MED ORDER — AMLODIPINE BESYLATE 5 MG PO TABS: ORAL_TABLET | ORAL | 1 refills | Status: DC

## 2020-07-16 MED ORDER — ALBUTEROL SULFATE HFA 108 (90 BASE) MCG/ACT IN AERS: 2.0000 | INHALATION_SPRAY | Freq: Four times a day (QID) | RESPIRATORY_TRACT | 1 refills | Status: DC | PRN

## 2020-07-16 MED ORDER — LEVOCETIRIZINE DIHYDROCHLORIDE 5 MG PO TABS: ORAL_TABLET | ORAL | 1 refills | Status: DC

## 2020-07-20 ENCOUNTER — Other Ambulatory Visit: Payer: Self-pay

## 2020-07-20 MED ORDER — ALBUTEROL SULFATE HFA 108 (90 BASE) MCG/ACT IN AERS: 2.0000 | INHALATION_SPRAY | Freq: Four times a day (QID) | RESPIRATORY_TRACT | 1 refills | Status: DC | PRN

## 2020-07-26 ENCOUNTER — Other Ambulatory Visit: Payer: Self-pay

## 2020-07-26 ENCOUNTER — Telehealth: Payer: Self-pay

## 2020-07-26 MED ORDER — PROAIR HFA 108 (90 BASE) MCG/ACT IN AERS: INHALATION_SPRAY | RESPIRATORY_TRACT | 4 refills | Status: DC

## 2020-07-26 NOTE — Telephone Encounter (Signed)
Pt was notified that proair prescriptions was sent to the pt's pharmacy.

## 2020-09-23 ENCOUNTER — Other Ambulatory Visit: Payer: Self-pay | Admitting: Internal Medicine

## 2020-09-23 DIAGNOSIS — Z1231 Encounter for screening mammogram for malignant neoplasm of breast: Secondary | ICD-10-CM

## 2020-11-15 ENCOUNTER — Other Ambulatory Visit: Payer: Self-pay

## 2020-11-15 ENCOUNTER — Encounter: Payer: Self-pay | Admitting: Internal Medicine

## 2020-11-15 ENCOUNTER — Ambulatory Visit (INDEPENDENT_AMBULATORY_CARE_PROVIDER_SITE_OTHER): Payer: 59 | Admitting: Internal Medicine

## 2020-11-15 VITALS — BP 112/70 | HR 76 | Temp 97.6°F | Ht 64.4 in | Wt 209.4 lb

## 2020-11-15 DIAGNOSIS — R7309 Other abnormal glucose: Secondary | ICD-10-CM

## 2020-11-15 DIAGNOSIS — I10 Essential (primary) hypertension: Secondary | ICD-10-CM

## 2020-11-15 DIAGNOSIS — D500 Iron deficiency anemia secondary to blood loss (chronic): Secondary | ICD-10-CM | POA: Diagnosis not present

## 2020-11-15 DIAGNOSIS — Z1211 Encounter for screening for malignant neoplasm of colon: Secondary | ICD-10-CM

## 2020-11-15 DIAGNOSIS — Z6835 Body mass index (BMI) 35.0-35.9, adult: Secondary | ICD-10-CM

## 2020-11-15 NOTE — Patient Instructions (Signed)

## 2020-11-15 NOTE — Progress Notes (Signed)
I,Yamilka Roman [employer] as a Education administrator for [name], MD.,have documented all relevant documentation on the behalf of [name], MD,as directed by  [name], MD while in the presence of [name], MD.  This visit occurred during the SARS-CoV-2 public health emergency.  Safety protocols were in place, including screening questions prior to the visit, additional usage of staff PPE, and extensive cleaning of exam room while observing appropriate contact time as indicated for disinfecting solutions.  Subjective:     Patient ID: [name] , female    DOB: 1975/09/29 , 45 y.o.   MRN: [id number]   Chief Complaint  Patient presents with   Hypertension    HPI  Patient is here today for blood pressure f/u. She has been compliant with her medications. She denies headaches, chest pain and shortness of breath.   Hypertension This is a chronic problem. The current episode started more than 1 month ago. The problem has been gradually improving since onset. The problem is controlled. Pertinent negatives include no blurred vision, chest pain, headaches, PND or shortness of breath. Past treatments include calcium channel blockers. The current treatment provides moderate improvement.    Past Medical History:  Diagnosis Date   Fibroids      Family History  Problem Relation Age of Onset   Hypertension Mother    Hyperthyroidism Mother    Prostate cancer Maternal Grandfather      Current Outpatient Medications:    albuterol (VENTOLIN HFA) 108 (90 Base) MCG/ACT inhaler, Inhale 2 puffs into the lungs every 6 (six) hours as needed for wheezing or shortness of breath., Disp: 18 g, Rfl: 1   amLODipine (NORVASC) 5 MG tablet, TAKE 1 TABLET(5 MG) BY MOUTH AT BEDTIME, Disp: 90 tablet, Rfl: 1   Ascorbic Acid (VITAMIN C PO), Take by mouth daily., Disp: , Rfl:    Cholecalciferol (VITAMIN D PO), Take by mouth daily., Disp: , Rfl:    cyclobenzaprine (FLEXERIL) 10 MG tablet, Take 1  tablet (10 mg total) by mouth 3 (three) times daily as needed for muscle spasms., Disp: 30 tablet, Rfl: 0   levocetirizine (XYZAL) 5 MG tablet, TAKE 1 TABLET BY MOUTH EVERY DAY IN THE EVENING, Disp: 90 tablet, Rfl: 1   Multiple Vitamin (MULTIVITAMIN) tablet, Take 1 tablet by mouth daily., Disp: , Rfl:    VITAMIN E PO, Take by mouth daily., Disp: , Rfl:    No Known Allergies   Review of Systems  Constitutional:  Positive for fatigue.  Eyes:  Negative for blurred vision.  Respiratory: Negative.  Negative for shortness of breath.   Cardiovascular: Negative.  Negative for chest pain and PND.  Gastrointestinal: Negative.   Genitourinary:  Positive for vaginal bleeding.  Neurological: Negative.  Negative for headaches.  Psychiatric/Behavioral: Negative.      Today's Vitals   11/15/20 0944  BP: 112/70  Pulse: 76  Temp: 97.6 F (36.4 C)  Weight: 209 lb 6.4 oz (95 kg)  Height: 5' 4.4" (1.636 m)  PainSc: 0-No pain   Body mass index is 35.5 kg/m.  Wt Readings from Last 3 Encounters:  11/15/20 209 lb 6.4 oz (95 kg)  06/02/20 207 lb (93.9 kg)  03/30/20 208 lb 12.8 oz (94.7 kg)    BP Readings from Last 3 Encounters:  11/15/20 112/70  06/02/20 (!) 144/72  03/30/20 132/70    Objective:  Physical Exam Vitals and nursing note reviewed.  Constitutional:      Appearance: Normal appearance.  HENT:  Head: Normocephalic and atraumatic.     Nose:     Comments: Masked     Mouth/Throat:     Comments: Masked  Cardiovascular:     Rate and Rhythm: Normal rate and regular rhythm.     Heart sounds: Normal heart sounds.  Pulmonary:     Effort: Pulmonary effort is normal.     Breath sounds: Normal breath sounds.  Musculoskeletal:     Cervical back: Normal range of motion.  Skin:    General: Skin is warm.  Neurological:     General: No focal deficit present.     Mental Status: She is alert.  Psychiatric:        Mood and Affect: Mood normal.        Behavior: Behavior normal.         Assessment And Plan:     1. Essential hypertension Comments: Chronic, well controlled. She will c/w amlodipine '5mg'$  daily. She is encouraged to follow low sodium diet.   2. Iron deficiency anemia due to chronic blood loss Comments: She has h/o uterine leiomyoma w/ menorrhagia.  - CBC no Diff - Iron, TIBC and Ferritin Panel  3. Other abnormal glucose Comments: Her a1c has been elevated in the past. I will recheck an a1c today. Encouraged to limit her intake of sweetened beverages, including diet drinks.  - Hemoglobin A1c  4. Class 2 severe obesity due to excess calories with serious comorbidity and body mass index (BMI) of 35.0 to 35.9 in adult [facility]) Comments: She is encouraged to strive for BMI less than 30 to decrease cardiac risk. Advised to aim for at least 150 minutes of exercise per week.   5. Screening for colon cancer Comments: I will refer her to GI for CRC screening. - Ambulatory referral to Gastroenterology   Patient was given opportunity to ask questions. Patient verbalized understanding of the plan and was able to repeat key elements of the plan. All questions were answered to their satisfaction.   I, [name], MD, have reviewed all documentation for this visit. The documentation on 11/16/20 for the exam, diagnosis, procedures, and orders are all accurate and complete.   IF YOU HAVE BEEN REFERRED TO A SPECIALIST, IT MAY TAKE 1-2 WEEKS TO SCHEDULE/PROCESS THE REFERRAL. IF YOU HAVE NOT HEARD FROM US/SPECIALIST IN TWO WEEKS, PLEASE GIVE [place] A CALL AT [phone].   THE PATIENT IS ENCOURAGED TO PRACTICE SOCIAL DISTANCING DUE TO THE COVID-19 PANDEMIC.

## 2020-11-16 LAB — HEMOGLOBIN A1C
Est. average glucose Bld gHb Est-mCnc: 123 mg/dL
Hgb A1c MFr Bld: 5.9 % — ABNORMAL HIGH (ref 4.8–5.6)

## 2020-11-16 LAB — CBC
Hematocrit: 38 % (ref 34.0–46.6)
Hemoglobin: 11.6 g/dL (ref 11.1–15.9)
MCH: 23 pg — ABNORMAL LOW (ref 26.6–33.0)
MCHC: 30.5 g/dL — ABNORMAL LOW (ref 31.5–35.7)
MCV: 75 fL — ABNORMAL LOW (ref 79–97)
Platelets: 377 10*3/uL (ref 150–450)
RBC: 5.05 x10E6/uL (ref 3.77–5.28)
RDW: 16.2 % — ABNORMAL HIGH (ref 11.7–15.4)
WBC: 5.6 10*3/uL (ref 3.4–10.8)

## 2020-11-16 LAB — IRON,TIBC AND FERRITIN PANEL
Ferritin: 5 ng/mL — ABNORMAL LOW (ref 15–150)
Iron Saturation: 8 % — CL (ref 15–55)
Iron: 32 ug/dL (ref 27–159)
Total Iron Binding Capacity: 424 ug/dL (ref 250–450)
UIBC: 392 ug/dL (ref 131–425)

## 2020-11-17 ENCOUNTER — Encounter: Payer: Self-pay | Admitting: Internal Medicine

## 2020-11-18 ENCOUNTER — Other Ambulatory Visit: Payer: Self-pay

## 2020-11-18 ENCOUNTER — Ambulatory Visit
Admission: RE | Admit: 2020-11-18 | Discharge: 2020-11-18 | Disposition: A | Discharge status: Home / Self Care | Payer: 59 | Source: Ambulatory Visit | Attending: Internal Medicine | Admitting: Internal Medicine

## 2020-11-18 DIAGNOSIS — Z1231 Encounter for screening mammogram for malignant neoplasm of breast: Secondary | ICD-10-CM

## 2020-11-23 ENCOUNTER — Other Ambulatory Visit: Payer: Self-pay | Admitting: Internal Medicine

## 2020-11-23 DIAGNOSIS — D500 Iron deficiency anemia secondary to blood loss (chronic): Secondary | ICD-10-CM

## 2020-11-24 ENCOUNTER — Telehealth: Payer: Self-pay | Admitting: *Deleted

## 2020-11-24 NOTE — Telephone Encounter (Signed)
Per referral Dr. [facility] - called and gave upcoming appointments - confirmed - mailed welcome packet with calendar

## 2020-12-02 ENCOUNTER — Ambulatory Visit: Payer: 59 | Admitting: Family

## 2020-12-02 ENCOUNTER — Other Ambulatory Visit: Payer: 59

## 2020-12-29 ENCOUNTER — Ambulatory Visit: Payer: 59 | Attending: Internal Medicine

## 2020-12-29 ENCOUNTER — Other Ambulatory Visit (HOSPITAL_BASED_OUTPATIENT_CLINIC_OR_DEPARTMENT_OTHER): Payer: Self-pay

## 2020-12-29 DIAGNOSIS — Z23 Encounter for immunization: Secondary | ICD-10-CM

## 2020-12-29 MED ORDER — INFLUENZA VAC SPLIT QUAD 0.5 ML IM SUSY
PREFILLED_SYRINGE | INTRAMUSCULAR | 0 refills | Status: DC
  Filled 2020-12-29: qty 0.5, 1d supply, fill #0

## 2020-12-29 NOTE — Progress Notes (Signed)
   Covid-19 Vaccination Clinic  Name:  [name]    MRN: [id number] DOB: [birth year]  12/29/2020  [name] was observed post Covid-19 immunization for 15 minutes without incident. She was provided with Vaccine Information Sheet and instruction to access the V-Safe system.   [name] was instructed to call [phone] with any severe reactions post vaccine: Difficulty breathing  Swelling of face and throat  A fast heartbeat  A bad rash all over body  Dizziness and weakness

## 2021-01-03 ENCOUNTER — Other Ambulatory Visit: Payer: Self-pay | Admitting: Family

## 2021-01-03 DIAGNOSIS — D649 Anemia, unspecified: Secondary | ICD-10-CM

## 2021-01-04 ENCOUNTER — Encounter: Payer: Self-pay | Admitting: Family

## 2021-01-04 ENCOUNTER — Inpatient Hospital Stay (HOSPITAL_BASED_OUTPATIENT_CLINIC_OR_DEPARTMENT_OTHER): Payer: 59 | Admitting: Family

## 2021-01-04 ENCOUNTER — Inpatient Hospital Stay: Payer: 59 | Attending: Hematology & Oncology

## 2021-01-04 ENCOUNTER — Other Ambulatory Visit: Payer: Self-pay

## 2021-01-04 ENCOUNTER — Other Ambulatory Visit (HOSPITAL_BASED_OUTPATIENT_CLINIC_OR_DEPARTMENT_OTHER): Payer: Self-pay

## 2021-01-04 VITALS — BP 134/80 | HR 95 | Temp 99.1°F | Resp 18 | Ht 63.0 in | Wt 210.4 lb

## 2021-01-04 DIAGNOSIS — D500 Iron deficiency anemia secondary to blood loss (chronic): Secondary | ICD-10-CM | POA: Diagnosis not present

## 2021-01-04 DIAGNOSIS — I10 Essential (primary) hypertension: Secondary | ICD-10-CM | POA: Diagnosis not present

## 2021-01-04 DIAGNOSIS — D649 Anemia, unspecified: Secondary | ICD-10-CM

## 2021-01-04 LAB — LACTATE DEHYDROGENASE: LDH: 183 U/L (ref 98–192)

## 2021-01-04 LAB — CMP (CANCER CENTER ONLY)
ALT: 17 U/L (ref 0–44)
AST: 15 U/L (ref 15–41)
Albumin: 4.2 g/dL (ref 3.5–5.0)
Alkaline Phosphatase: 69 U/L (ref 38–126)
Anion gap: 10 (ref 5–15)
BUN: 11 mg/dL (ref 6–20)
CO2: 24 mmol/L (ref 22–32)
Calcium: 8.9 mg/dL (ref 8.9–10.3)
Chloride: 104 mmol/L (ref 98–111)
Creatinine: 0.89 mg/dL (ref 0.44–1.00)
GFR, Estimated: >60 mL/min (ref >60)
Glucose, Bld: 114 mg/dL — ABNORMAL HIGH (ref 70–99)
Potassium: 3.5 mmol/L (ref 3.5–5.1)
Sodium: 138 mmol/L (ref 135–145)
Total Bilirubin: 0.4 mg/dL (ref 0.3–1.2)
Total Protein: 7.4 g/dL (ref 6.5–8.1)

## 2021-01-04 LAB — RETICULOCYTES
Immature Retic Fract: 23.5 % — ABNORMAL HIGH (ref 2.3–15.9)
RBC.: 4.54 MIL/uL (ref 3.87–5.11)
Retic Count, Absolute: 84.9 10*3/uL (ref 19.0–186.0)
Retic Ct Pct: 1.9 % (ref 0.4–3.1)

## 2021-01-04 LAB — CBC WITH DIFFERENTIAL (CANCER CENTER ONLY)
Abs Immature Granulocytes: 0.09 10*3/uL — ABNORMAL HIGH (ref 0.00–0.07)
Basophils Absolute: 0 10*3/uL (ref 0.0–0.1)
Basophils Relative: 0 %
Eosinophils Absolute: 0.2 10*3/uL (ref 0.0–0.5)
Eosinophils Relative: 3 %
HCT: 34 % — ABNORMAL LOW (ref 36.0–46.0)
Hemoglobin: 10.4 g/dL — ABNORMAL LOW (ref 12.0–15.0)
Immature Granulocytes: 1 %
Lymphocytes Relative: 25 %
Lymphs Abs: 1.8 10*3/uL (ref 0.7–4.0)
MCH: 23.2 pg — ABNORMAL LOW (ref 26.0–34.0)
MCHC: 30.6 g/dL (ref 30.0–36.0)
MCV: 75.9 fL — ABNORMAL LOW (ref 80.0–100.0)
Monocytes Absolute: 0.5 10*3/uL (ref 0.1–1.0)
Monocytes Relative: 7 %
Neutro Abs: 4.5 10*3/uL (ref 1.7–7.7)
Neutrophils Relative %: 64 %
Platelet Count: 368 10*3/uL (ref 150–400)
RBC: 4.48 MIL/uL (ref 3.87–5.11)
RDW: 16.6 % — ABNORMAL HIGH (ref 11.5–15.5)
WBC Count: 7 10*3/uL (ref 4.0–10.5)
nRBC: 0 % (ref 0.0–0.2)

## 2021-01-04 MED ORDER — COVID-19MRNA BIVAL VACC PFIZER 30 MCG/0.3ML IM SUSP
INTRAMUSCULAR | 0 refills | Status: DC
  Filled 2021-01-04: qty 0.3, 1d supply, fill #0

## 2021-01-04 NOTE — Progress Notes (Signed)
Hematology/Oncology Consultation   Name: [name]      MRN: [id number]    Location: Room/bed info not found  Date: 01/04/2021 Time:3:33 PM   REFERRING PHYSICIAN:  Glendale Chard, MD  REASON FOR CONSULT: Iron deficiency anemia due to chronic blood loss   DIAGNOSIS: Iron deficiency anemia secondary to heavy cycle  HISTORY OF PRESENT ILLNESS:  [name] is a very pleasant 45 yo [place] American female with history of iron deficiency off and on since childhood.  She has uterine fibroids with a regular, heavy cycle. No other blood loss noted.  No abnormal bruising, no petechiae.  In August, her iron saturation was 8% and ferritin 5. Hgb today is 10.4, MCV 75, platelets 368 and WBC count 7.0.  She is symptomatic with fatigue, weakness, SOB with exertion and chewing lots of ice.  She has taken an oral iron supplement in the past. No IV iron history.  No known sickle cell disease or trait.  No history of pregnancy or miscarriage.  She is followed closely by her gynecologist and has had a uterine emobolization in the past. She is now on observation.  She has no other past surgical history.  No history of diabetes or thyroid disease.  She states that her HTN is well controlled on Norvasc.  No fever, chills, n/v, cough, rash, dizziness, chest pain, palpitations, abdominal pain or changes in bowel or bladder habits.  No swelling, tenderness, numbness or tingling in her extremities at this time. She has occasional episode of tingling in her fingertips which she has associated with carrying her mail satchel.   No falls or syncope.  No smoking or recreational drug use. She has the occasional alcoholic beverage socially.  She has maintained a good appetite and is staying well hydrated. Throughout the day. Her weight is 210 lbs.  She works for the [employer]. She has been there for over 20 years and enjoys her job.    ROS: All other 10 point review of systems is negative.    PAST MEDICAL HISTORY:   Past Medical History:  Diagnosis Date   Fibroids     ALLERGIES: No Known Allergies    MEDICATIONS:  Current Outpatient Medications on File Prior to Visit  Medication Sig Dispense Refill   albuterol (VENTOLIN HFA) 108 (90 Base) MCG/ACT inhaler Inhale 2 puffs into the lungs every 6 (six) hours as needed for wheezing or shortness of breath. 18 g 1   amLODipine (NORVASC) 5 MG tablet TAKE 1 TABLET(5 MG) BY MOUTH AT BEDTIME 90 tablet 1   Ascorbic Acid (VITAMIN C PO) Take by mouth daily.     cyclobenzaprine (FLEXERIL) 10 MG tablet Take 1 tablet (10 mg total) by mouth 3 (three) times daily as needed for muscle spasms. 30 tablet 0   levocetirizine (XYZAL) 5 MG tablet TAKE 1 TABLET BY MOUTH EVERY DAY IN THE EVENING 90 tablet 1   Multiple Vitamin (MULTIVITAMIN) tablet Take 1 tablet by mouth daily.     No current facility-administered medications on file prior to visit.     PAST SURGICAL HISTORY Past Surgical History:  Procedure Laterality Date   BILATERAL SALPINGECTOMY  2013   along with myomectomy   IR GENERIC HISTORICAL  11/18/2015   IR RADIOLOGIST EVAL & MGMT 11/18/2015 Sandi Mariscal, MD [facility] INTERV RAD   ROBOT ASSISTED MYOMECTOMY  2013   for uterine fibroids; along with salpingectomy    FAMILY HISTORY: Family History  Problem Relation Age of Onset  Hypertension Mother    Hyperthyroidism Mother    Prostate cancer Maternal Grandfather     SOCIAL HISTORY:  reports that she has never smoked. She has never used smokeless tobacco. She reports current alcohol use of about 2.0 standard drinks per week. She reports that she does not use drugs.  PERFORMANCE STATUS: The patient's performance status is 1 - Symptomatic but completely ambulatory  PHYSICAL EXAM: Most Recent Vital Signs: There were no vitals taken for this visit. BP 134/80 (BP Location: Left Arm, Patient Position: Sitting)   Pulse 95   Temp 99.1 F (37.3 C) (Oral)   Resp 18   Ht 5\' 3"  (1.6 m)    Wt 210 lb 6.4 oz (95.4 kg)   SpO2 100%   BMI 37.27 kg/m   General Appearance:    Alert, cooperative, no distress, appears stated age  Head:    Normocephalic, without obvious abnormality, atraumatic  Eyes:    PERRL, conjunctiva/corneas clear, EOM's intact, fundi    benign, both eyes        Throat:   Lips, mucosa, and tongue normal; teeth and gums normal  Neck:   Supple, symmetrical, trachea midline, no adenopathy;    thyroid:  no enlargement/tenderness/nodules; no carotid   bruit or JVD  Back:     Symmetric, no curvature, ROM normal, no CVA tenderness  Lungs:     Clear to auscultation bilaterally, respirations unlabored  Chest Wall:    No tenderness or deformity   Heart:    Regular rate and rhythm, S1 and S2 normal, no murmur, rub   or gallop     Abdomen:     Soft, non-tender, bowel sounds active all four quadrants,    no masses, no organomegaly        Extremities:   Extremities normal, atraumatic, no cyanosis or edema  Pulses:   2+ and symmetric all extremities  Skin:   Skin color, texture, turgor normal, no rashes or lesions  Lymph nodes:   Cervical, supraclavicular, and axillary nodes normal  Neurologic:   CNII-XII intact, normal strength, sensation and reflexes    throughout    LABORATORY DATA:  Results for orders placed or performed in visit on 01/04/21 (from the past 48 hour(s))  CBC with Differential (Cancer Center Only)     Status: Abnormal   Collection Time: 01/04/21  2:57 PM  Result Value Ref Range   WBC Count 7.0 4.0 - 10.5 K/uL   RBC 4.48 3.87 - 5.11 MIL/uL   Hemoglobin 10.4 (L) 12.0 - 15.0 g/dL    Comment: Reticulocyte Hemoglobin testing may be clinically indicated, consider ordering this additional test [id number]    HCT 34.0 (L) 36.0 - 46.0 %   MCV 75.9 (L) 80.0 - 100.0 fL   MCH 23.2 (L) 26.0 - 34.0 pg   MCHC 30.6 30.0 - 36.0 g/dL   RDW 16.6 (H) 11.5 - 15.5 %   Platelet Count 368 150 - 400 K/uL   nRBC 0.0 0.0 - 0.2 %   Neutrophils Relative % 64 %    Neutro Abs 4.5 1.7 - 7.7 K/uL   Lymphocytes Relative 25 %   Lymphs Abs 1.8 0.7 - 4.0 K/uL   Monocytes Relative 7 %   Monocytes Absolute 0.5 0.1 - 1.0 K/uL   Eosinophils Relative 3 %   Eosinophils Absolute 0.2 0.0 - 0.5 K/uL   Basophils Relative 0 %   Basophils Absolute 0.0 0.0 - 0.1 K/uL   Immature Granulocytes 1 %  Abs Immature Granulocytes 0.09 (H) 0.00 - 0.07 K/uL    Comment: Performed at [facility] at [facility], [address], [place], [place] [address]  Reticulocytes     Status: Abnormal   Collection Time: 01/04/21  2:58 PM  Result Value Ref Range   Retic Ct Pct 1.9 0.4 - 3.1 %   RBC. 4.54 3.87 - 5.11 MIL/uL   Retic Count, Absolute 84.9 19.0 - 186.0 K/uL   Immature Retic Fract 23.5 (H) 2.3 - 15.9 %    Comment: Performed at [facility] at [facility], [address] [place], [place] [address]      RADIOGRAPHY: No results found.     PATHOLOGY: None  ASSESSMENT/PLAN: Ms. Ladley is a very pleasant 45 yo [place] American female with history of iron deficiency off and on since childhood.  Iron studies are pending. We will get her set up for IV iron replacement if needed.  Hgb fractionation cascade and alpha thalassemia tests pending. She may also benefit from daily folic acid.  We will plan to see her again for follow-up in 6 weeks.   All questions were answered. The patient knows to call the clinic with any problems, questions or concerns. We can certainly see the patient much sooner if necessary.   Lottie Dawson, NP

## 2021-01-05 LAB — ERYTHROPOIETIN: Erythropoietin: 58.1 m[IU]/mL — ABNORMAL HIGH (ref 2.6–18.5)

## 2021-01-05 LAB — IRON AND TIBC
Iron: 32 ug/dL — ABNORMAL LOW (ref 41–142)
Saturation Ratios: 7 % — ABNORMAL LOW (ref 21–57)
TIBC: 444 ug/dL (ref 236–444)
UIBC: 412 ug/dL — ABNORMAL HIGH (ref 120–384)

## 2021-01-05 LAB — FERRITIN: Ferritin: <4 ng/mL — ABNORMAL LOW (ref 11–307)

## 2021-01-06 LAB — HGB FRACTIONATION CASCADE
Hgb A2: 1.9 % (ref 1.8–3.2)
Hgb A: 98.1 % (ref 96.4–98.8)
Hgb F: 0 % (ref 0.0–2.0)
Hgb S: 0 %

## 2021-01-07 ENCOUNTER — Other Ambulatory Visit: Payer: Self-pay | Admitting: Family

## 2021-01-07 DIAGNOSIS — D500 Iron deficiency anemia secondary to blood loss (chronic): Secondary | ICD-10-CM

## 2021-01-07 DIAGNOSIS — D509 Iron deficiency anemia, unspecified: Secondary | ICD-10-CM | POA: Insufficient documentation

## 2021-01-10 ENCOUNTER — Other Ambulatory Visit: Payer: Self-pay

## 2021-01-10 ENCOUNTER — Inpatient Hospital Stay: Payer: 59 | Attending: Hematology & Oncology

## 2021-01-10 VITALS — BP 123/67 | HR 79 | Temp 98.9°F | Resp 17

## 2021-01-10 DIAGNOSIS — D500 Iron deficiency anemia secondary to blood loss (chronic): Secondary | ICD-10-CM | POA: Insufficient documentation

## 2021-01-10 MED ORDER — SODIUM CHLORIDE 0.9 % IV SOLN
200.0000 mg | Freq: Once | INTRAVENOUS | Status: AC
  Administered 2021-01-10: 200 mg via INTRAVENOUS
  Filled 2021-01-10: qty 200

## 2021-01-10 MED ORDER — SODIUM CHLORIDE 0.9 % IV SOLN: Freq: Once | INTRAVENOUS | Status: AC

## 2021-01-10 NOTE — Patient Instructions (Signed)

## 2021-01-12 IMAGING — MG DIGITAL DIAGNOSTIC UNILATERAL RIGHT MAMMOGRAM WITH TOMO AND CAD
4 series · 4 of 12 positions shown · non-contrast
Comparison: Previous exam(s).

CLINICAL DATA: Recall from screening mammography with
tomosynthesis, possible focal asymmetry involving the INNER RIGHT
breast at MIDDLE to POSTERIOR depth.

EXAM:
DIGITAL DIAGNOSTIC RIGHT MAMMOGRAM WITH TOMO

[R MLO synth-2D]
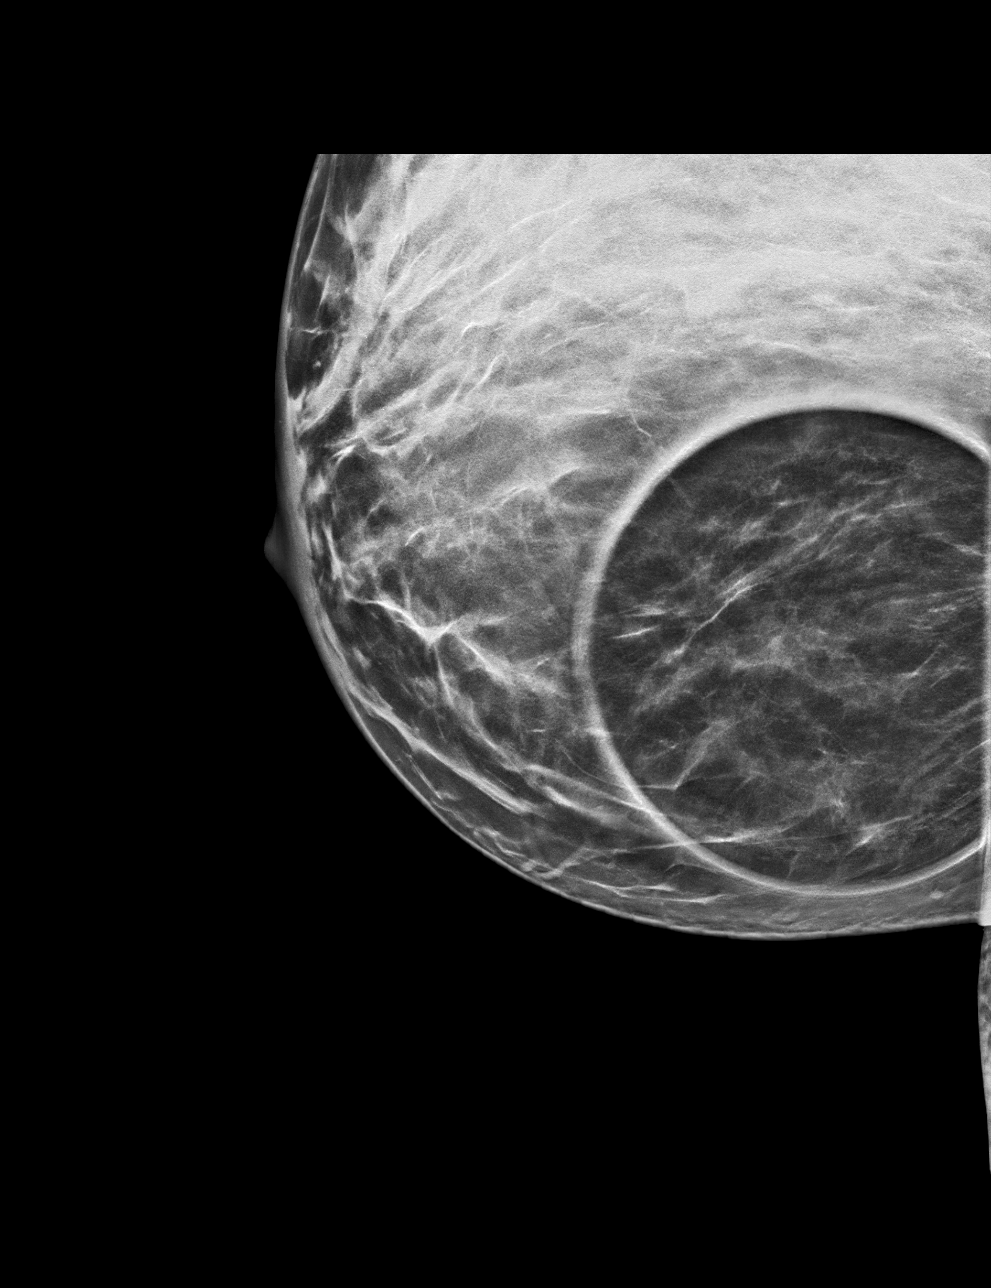

[R CC synth-2D]
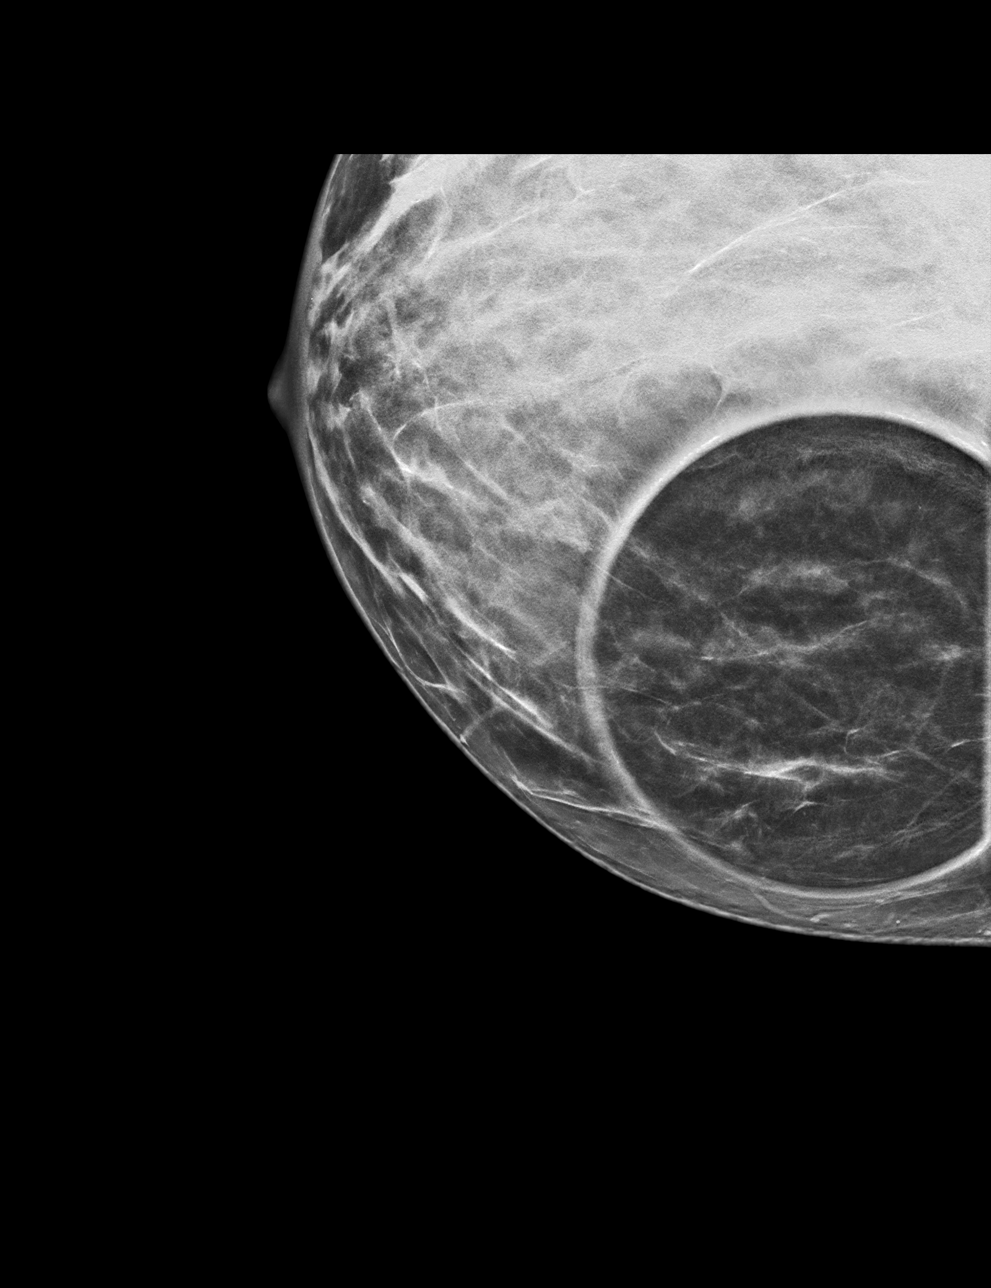

[R MLO tomo · tomo slice 31/62.0]
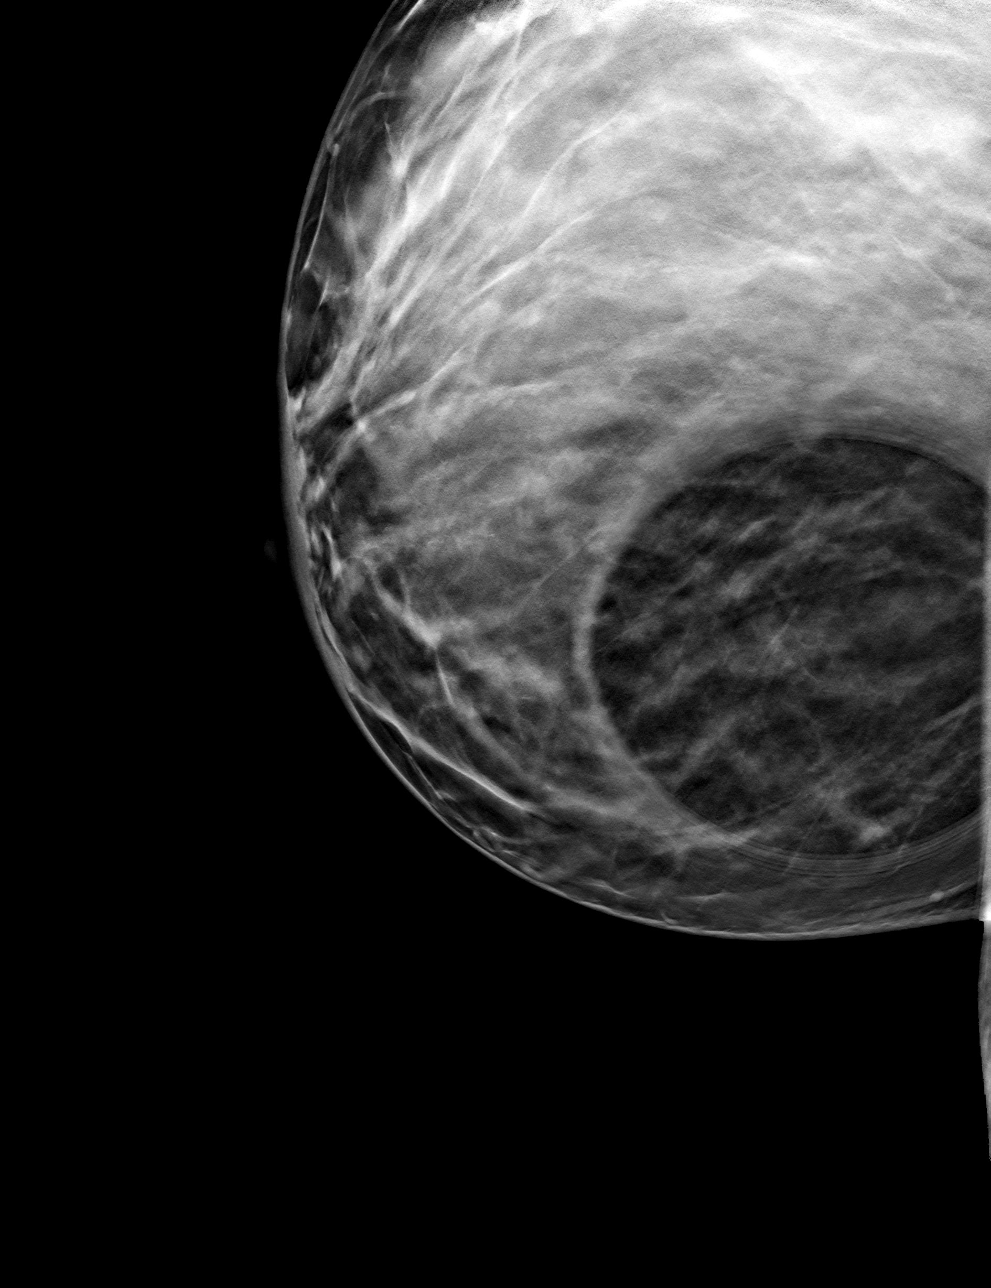

[R CC tomo · tomo slice 30/59.0]
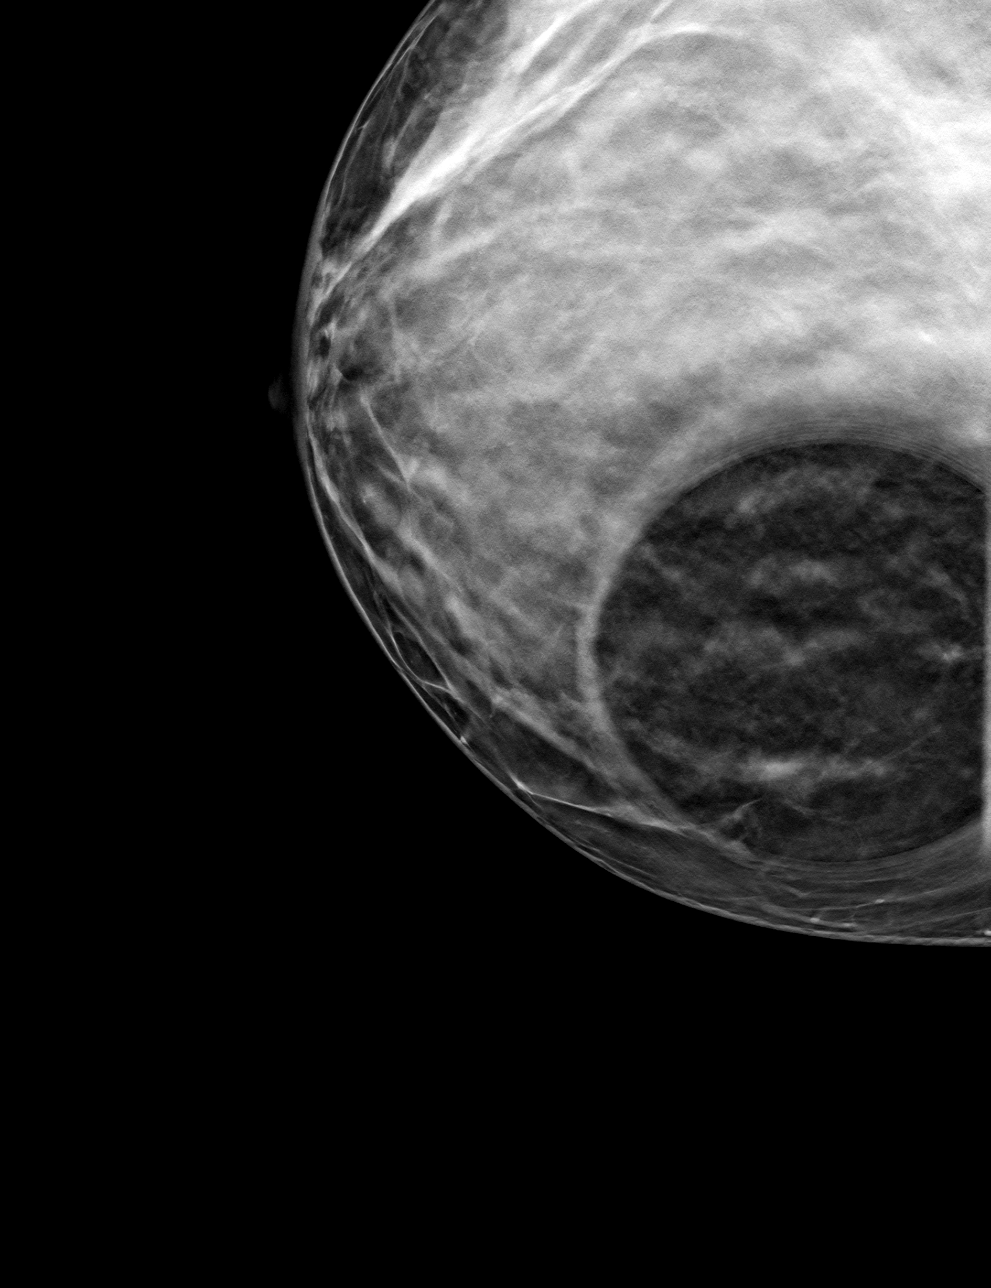

[4 of 12 positions shown; findings below may reference images not displayed]

ACR Breast Density Category c: The breast tissue is heterogeneously
dense, which may obscure small masses.
FINDINGS: Tomosynthesis and synthesized spot-compression CC and MLO views of
the area of concern in the RIGHT breast were obtained.

The focal asymmetry questioned on screening mammography completely
disperses with compression there is no underlying mass or
architectural distortion.
IMPRESSION: 1. No mammographic evidence of malignancy involving the RIGHT
breast.
2. The asymmetry questioned on screening mammography corresponds to
focally dense fibroglandular tissue.

RECOMMENDATION:
Screening mammogram in one year.(Code:[id number])

I have discussed the findings and recommendations with the patient.
If applicable, a reminder letter will be sent to the patient
regarding the next appointment.

BI-RADS CATEGORY  1: Negative.

## 2021-01-14 LAB — ALPHA-THALASSEMIA GENOTYPR

## 2021-01-16 ENCOUNTER — Other Ambulatory Visit: Payer: Self-pay | Admitting: Internal Medicine

## 2021-01-18 ENCOUNTER — Inpatient Hospital Stay: Payer: 59

## 2021-01-18 ENCOUNTER — Other Ambulatory Visit: Payer: Self-pay | Admitting: Family

## 2021-01-18 ENCOUNTER — Telehealth: Payer: Self-pay | Admitting: *Deleted

## 2021-01-18 ENCOUNTER — Other Ambulatory Visit: Payer: Self-pay

## 2021-01-18 VITALS — BP 118/68 | HR 75 | Temp 98.0°F | Resp 16

## 2021-01-18 DIAGNOSIS — D500 Iron deficiency anemia secondary to blood loss (chronic): Secondary | ICD-10-CM

## 2021-01-18 DIAGNOSIS — D563 Thalassemia minor: Secondary | ICD-10-CM

## 2021-01-18 MED ORDER — SODIUM CHLORIDE 0.9 % IV SOLN: Freq: Once | INTRAVENOUS | Status: AC

## 2021-01-18 MED ORDER — SODIUM CHLORIDE 0.9 % IV SOLN
200.0000 mg | Freq: Once | INTRAVENOUS | Status: AC
  Administered 2021-01-18: 200 mg via INTRAVENOUS
  Filled 2021-01-18: qty 200

## 2021-01-18 MED ORDER — FOLIC ACID 1 MG PO TABS: 1.0000 mg | ORAL_TABLET | Freq: Every day | ORAL | 6 refills | Status: DC

## 2021-01-18 NOTE — Telephone Encounter (Addendum)
-----   Message from Celso Amy, NP sent at 01/18/2021  9:18 AM EDT -----  Message below received from [facility] NP.  Patient here in infusion.  Message given to her.     She was positive for the alpha thalassemia minor trait. This is quite common in the African American population and means she is genetically predisposed to making smaller and less red blood cells. The treatment is daily folic acid. I have [facility] [place]! Thank you!  Sarah  ----- Message ----- From: Buel Ream, Lab In [place] Sent: 01/04/2021   3:17 PM EDT To: Celso Amy, NP

## 2021-01-18 NOTE — Patient Instructions (Signed)

## 2021-01-25 ENCOUNTER — Other Ambulatory Visit: Payer: Self-pay

## 2021-01-25 ENCOUNTER — Inpatient Hospital Stay: Payer: 59

## 2021-01-25 VITALS — BP 127/76 | HR 88 | Temp 98.4°F | Resp 20

## 2021-01-25 DIAGNOSIS — D500 Iron deficiency anemia secondary to blood loss (chronic): Secondary | ICD-10-CM | POA: Diagnosis not present

## 2021-01-25 MED ORDER — SODIUM CHLORIDE 0.9 % IV SOLN
200.0000 mg | Freq: Once | INTRAVENOUS | Status: AC
  Administered 2021-01-25: 200 mg via INTRAVENOUS
  Filled 2021-01-25: qty 200

## 2021-01-25 MED ORDER — SODIUM CHLORIDE 0.9 % IV SOLN: Freq: Once | INTRAVENOUS | Status: AC

## 2021-01-25 NOTE — Patient Instructions (Signed)

## 2021-02-01 ENCOUNTER — Other Ambulatory Visit: Payer: Self-pay

## 2021-02-01 ENCOUNTER — Inpatient Hospital Stay: Payer: 59

## 2021-02-01 VITALS — BP 138/67 | HR 86 | Temp 98.5°F | Resp 18

## 2021-02-01 DIAGNOSIS — D500 Iron deficiency anemia secondary to blood loss (chronic): Secondary | ICD-10-CM

## 2021-02-01 MED ORDER — SODIUM CHLORIDE 0.9 % IV SOLN: Freq: Once | INTRAVENOUS | Status: AC

## 2021-02-01 MED ORDER — SODIUM CHLORIDE 0.9 % IV SOLN
200.0000 mg | Freq: Once | INTRAVENOUS | Status: AC
  Administered 2021-02-01: 200 mg via INTRAVENOUS
  Filled 2021-02-01: qty 200

## 2021-02-01 NOTE — Patient Instructions (Signed)

## 2021-02-08 ENCOUNTER — Other Ambulatory Visit: Payer: Self-pay

## 2021-02-08 ENCOUNTER — Inpatient Hospital Stay: Payer: 59 | Attending: Hematology & Oncology

## 2021-02-08 VITALS — BP 135/70 | HR 88 | Temp 99.5°F | Resp 18

## 2021-02-08 DIAGNOSIS — D563 Thalassemia minor: Secondary | ICD-10-CM | POA: Diagnosis not present

## 2021-02-08 DIAGNOSIS — D500 Iron deficiency anemia secondary to blood loss (chronic): Secondary | ICD-10-CM | POA: Insufficient documentation

## 2021-02-08 MED ORDER — SODIUM CHLORIDE 0.9 % IV SOLN: Freq: Once | INTRAVENOUS | Status: AC

## 2021-02-08 MED ORDER — SODIUM CHLORIDE 0.9 % IV SOLN
200.0000 mg | Freq: Once | INTRAVENOUS | Status: AC
  Administered 2021-02-08: 200 mg via INTRAVENOUS
  Filled 2021-02-08: qty 200

## 2021-02-08 NOTE — Patient Instructions (Signed)

## 2021-02-16 ENCOUNTER — Other Ambulatory Visit: Payer: Self-pay

## 2021-02-16 ENCOUNTER — Inpatient Hospital Stay (HOSPITAL_BASED_OUTPATIENT_CLINIC_OR_DEPARTMENT_OTHER): Payer: 59

## 2021-02-16 ENCOUNTER — Encounter: Payer: Self-pay | Admitting: Family

## 2021-02-16 ENCOUNTER — Inpatient Hospital Stay (HOSPITAL_BASED_OUTPATIENT_CLINIC_OR_DEPARTMENT_OTHER): Payer: 59 | Admitting: Family

## 2021-02-16 VITALS — BP 137/78 | HR 98 | Temp 98.2°F | Resp 17 | Wt 213.8 lb

## 2021-02-16 DIAGNOSIS — D563 Thalassemia minor: Secondary | ICD-10-CM | POA: Diagnosis not present

## 2021-02-16 DIAGNOSIS — D500 Iron deficiency anemia secondary to blood loss (chronic): Secondary | ICD-10-CM

## 2021-02-16 LAB — CBC WITH DIFFERENTIAL (CANCER CENTER ONLY)
Abs Immature Granulocytes: 0.1 10*3/uL — ABNORMAL HIGH (ref 0.00–0.07)
Basophils Absolute: 0.1 10*3/uL (ref 0.0–0.1)
Basophils Relative: 1 %
Eosinophils Absolute: 0.2 10*3/uL (ref 0.0–0.5)
Eosinophils Relative: 3 %
HCT: 40.4 % (ref 36.0–46.0)
Hemoglobin: 12.8 g/dL (ref 12.0–15.0)
Immature Granulocytes: 2 %
Lymphocytes Relative: 25 %
Lymphs Abs: 1.7 10*3/uL (ref 0.7–4.0)
MCH: 25.7 pg — ABNORMAL LOW (ref 26.0–34.0)
MCHC: 31.7 g/dL (ref 30.0–36.0)
MCV: 81 fL (ref 80.0–100.0)
Monocytes Absolute: 0.3 10*3/uL (ref 0.1–1.0)
Monocytes Relative: 5 %
Neutro Abs: 4.4 10*3/uL (ref 1.7–7.7)
Neutrophils Relative %: 64 %
Platelet Count: 294 10*3/uL (ref 150–400)
RBC: 4.99 MIL/uL (ref 3.87–5.11)
RDW: 20.2 % — ABNORMAL HIGH (ref 11.5–15.5)
WBC Count: 6.7 10*3/uL (ref 4.0–10.5)
nRBC: 0 % (ref 0.0–0.2)

## 2021-02-16 LAB — RETICULOCYTES
Immature Retic Fract: 12.9 % (ref 2.3–15.9)
RBC.: 4.97 MIL/uL (ref 3.87–5.11)
Retic Count, Absolute: 96.4 10*3/uL (ref 19.0–186.0)
Retic Ct Pct: 1.9 % (ref 0.4–3.1)

## 2021-02-16 NOTE — Progress Notes (Signed)
Hematology and Oncology Follow Up Visit  [name] [id number] [birth year] 45 y.o. 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 She is doing quite well and notes that her energy and stamina are much improved.  Her cycle is regular and only heavy the first day. She has contacted her gynecologist and they are working on a treatment plan.  No other blood loss noted. No bruising or petechiae.  No fever, chills, n/v, cough, rash, dizziness, SOB, chest pain, palpitations, abdominal pain or changes in bowel or bladder habits.  No swelling, tenderness, numbness or tingling in her extremities at this time.  No falls or syncope to report.  She has a good appetite and is staying well hydrated. Her weight is stable at 213 lbs.   ECOG Performance Status: 1 - Symptomatic but completely ambulatory  Medications:  Allergies as of 02/16/2021   No Known Allergies      Medication List        Accurate as of February 16, 2021  2:13 PM. If you have any questions, ask your nurse or doctor.          albuterol 108 (90 Base) MCG/ACT inhaler Commonly known as: VENTOLIN HFA Inhale 2 puffs into the lungs every 6 (six) hours as needed for wheezing or shortness of breath.   amLODipine 5 MG tablet Commonly known as: NORVASC TAKE 1 TABLET(5 MG) BY MOUTH AT BEDTIME   cyclobenzaprine 10 MG tablet Commonly known as: FLEXERIL Take 1 tablet (10 mg total) by mouth 3 (three) times daily as needed for muscle spasms.   folic acid 1 MG tablet Commonly known as: FOLVITE Take 1 tablet (1 mg total) by mouth daily.   levocetirizine 5 MG tablet Commonly known as: XYZAL TAKE 1 TABLET BY MOUTH EVERY DAY IN THE EVENING   multivitamin tablet Take 1 tablet by mouth daily.   Sodium Fluoride 5000 Sensitive 1.1-5 %  Gel Generic drug: Sod Fluoride-Potassium Nitrate Take by mouth daily at 6 (six) AM.   VITAMIN C PO Take by mouth daily.        Allergies: No Known Allergies  Past Medical History, Surgical history, Social history, and Family History were reviewed and updated.  Review of Systems: All other 10 point review of systems is negative.   Physical Exam:  weight is 213 lb 12.8 oz (97 kg). Her oral temperature is 98.2 F (36.8 C). Her blood pressure is 137/78 and her pulse is 98. Her respiration is 17 and oxygen saturation is 100%.   Wt Readings from Last 3 Encounters:  02/16/21 213 lb 12.8 oz (97 kg)  01/04/21 210 lb 6.4 oz (95.4 kg)  11/15/20 209 lb 6.4 oz (95 kg)    Ocular: Sclerae unicteric, pupils equal, round and reactive to light Ear-nose-throat: Oropharynx clear, dentition fair Lymphatic: No cervical or supraclavicular adenopathy Lungs no rales or rhonchi, good excursion bilaterally Heart regular rate and rhythm, no murmur appreciated Abd soft, nontender, positive bowel sounds MSK no focal spinal tenderness, no joint edema Neuro: non-focal, well-oriented, appropriate affect Breasts: Deferred   Lab Results  Component Value Date   WBC 6.7 02/16/2021   HGB 12.8 02/16/2021   HCT 40.4 02/16/2021   MCV 81.0 02/16/2021   PLT 294 02/16/2021   Lab Results  Component Value Date  FERRITIN <4 (L) 01/04/2021   IRON 32 (L) 01/04/2021   TIBC 444 01/04/2021   UIBC 412 (H) 01/04/2021   IRONPCTSAT 7 (L) 01/04/2021   Lab Results  Component Value Date   RETICCTPCT 1.9 02/16/2021   RBC 4.99 02/16/2021   RBC 4.97 02/16/2021   No results found for: KPAFRELGTCHN, LAMBDASER, KAPLAMBRATIO No results found for: IGGSERUM, IGA, IGMSERUM No results found for: Odetta Pink, SPEI   Chemistry      Component Value Date/Time   NA 138 01/04/2021 1457   NA 138 03/15/2020 1532   K 3.5 01/04/2021 1457   CL 104 01/04/2021 1457   CO2 24  01/04/2021 1457   BUN 11 01/04/2021 1457   BUN 9 03/15/2020 1532   CREATININE 0.89 01/04/2021 1457      Component Value Date/Time   CALCIUM 8.9 01/04/2021 1457   ALKPHOS 69 01/04/2021 1457   AST 15 01/04/2021 1457   ALT 17 01/04/2021 1457   BILITOT 0.4 01/04/2021 1457       Impression and Plan: [name] is a very pleasant 45 yo African American female with history of iron deficiency off and on since childhood.  Iron studies are pending. We will replace if needed.  She will continue her folic acid.  Follow-up in 3 months.  She can contact our office with any questions or concerns.   Lottie Dawson, NP 11/9/20222:13 PM

## 2021-02-17 ENCOUNTER — Ambulatory Visit (INDEPENDENT_AMBULATORY_CARE_PROVIDER_SITE_OTHER): Payer: 59 | Admitting: Internal Medicine

## 2021-02-17 ENCOUNTER — Encounter: Payer: Self-pay | Admitting: Internal Medicine

## 2021-02-17 ENCOUNTER — Telehealth: Payer: Self-pay | Admitting: *Deleted

## 2021-02-17 VITALS — BP 136/78 | HR 81 | Temp 99.2°F | Ht 63.2 in | Wt 212.8 lb

## 2021-02-17 DIAGNOSIS — I10 Essential (primary) hypertension: Secondary | ICD-10-CM | POA: Diagnosis not present

## 2021-02-17 DIAGNOSIS — Z6837 Body mass index (BMI) 37.0-37.9, adult: Secondary | ICD-10-CM

## 2021-02-17 DIAGNOSIS — Z23 Encounter for immunization: Secondary | ICD-10-CM

## 2021-02-17 LAB — FERRITIN: Ferritin: 103 ng/mL (ref 11–307)

## 2021-02-17 LAB — IRON AND TIBC
Iron: 62 ug/dL (ref 41–142)
Saturation Ratios: 18 % — ABNORMAL LOW (ref 21–57)
TIBC: 336 ug/dL (ref 236–444)
UIBC: 275 ug/dL (ref 120–384)

## 2021-02-17 NOTE — Progress Notes (Signed)
Rich Brave [place],acting as a Education administrator for [name], MD.,have documented all relevant documentation on the behalf of [name], MD,as directed by  [name], MD while in the presence of [name], MD.  This visit occurred during the SARS-CoV-2 public health emergency.  Safety protocols were in place, including screening questions prior to the visit, additional usage of staff PPE, and extensive cleaning of exam room while observing appropriate contact time as indicated for disinfecting solutions.  Subjective:     Patient ID: [name] , female    DOB: 1976/01/26 , 45 y.o.   MRN: [id number]   Chief Complaint  Patient presents with   Hypertension    HPI  Patient is here today for blood pressure f/u. She has been compliant with her medications. She denies headaches, chest pain and shortness of breath.   Hypertension This is a chronic problem. The current episode started more than 1 month ago. The problem has been gradually improving since onset. The problem is controlled. Pertinent negatives include no blurred vision, chest pain, headaches, PND or shortness of breath. Past treatments include calcium channel blockers. The current treatment provides moderate improvement.    Past Medical History:  Diagnosis Date   Fibroids      Family History  Problem Relation Age of Onset   Hypertension Mother    Hyperthyroidism Mother    Prostate cancer Maternal Grandfather      Current Outpatient Medications:    albuterol (VENTOLIN HFA) 108 (90 Base) MCG/ACT inhaler, Inhale 2 puffs into the lungs every 6 (six) hours as needed for wheezing or shortness of breath., Disp: 18 g, Rfl: 1   amLODipine (NORVASC) 5 MG tablet, TAKE 1 TABLET(5 MG) BY MOUTH AT BEDTIME, Disp: 90 tablet, Rfl: 1   Ascorbic Acid (VITAMIN C PO), Take by mouth daily., Disp: , Rfl:    cyclobenzaprine (FLEXERIL) 10 MG tablet, Take 1 tablet (10 mg total) by mouth 3 (three) times daily as needed for  muscle spasms., Disp: 30 tablet, Rfl: 0   folic acid (FOLVITE) 1 MG tablet, Take 1 tablet (1 mg total) by mouth daily., Disp: 30 tablet, Rfl: 6   levocetirizine (XYZAL) 5 MG tablet, TAKE 1 TABLET BY MOUTH EVERY DAY IN THE EVENING, Disp: 90 tablet, Rfl: 1   Multiple Vitamin (MULTIVITAMIN) tablet, Take 1 tablet by mouth daily., Disp: , Rfl:    SODIUM FLUORIDE 5000 SENSITIVE 1.1-5 % GEL, Take by mouth daily at 6 (six) AM., Disp: , Rfl:    No Known Allergies   Review of Systems  Constitutional: Negative.   Eyes:  Negative for blurred vision.  Respiratory: Negative.  Negative for shortness of breath.   Cardiovascular: Negative.  Negative for chest pain and PND.  Gastrointestinal: Negative.   Neurological: Negative.  Negative for headaches.  Psychiatric/Behavioral: Negative.      Today's Vitals   02/17/21 1451 02/17/21 1513  BP: 138/80 136/78  Pulse: 81   Temp: 99.2 F (37.3 C)   Weight: 212 lb 12.8 oz (96.5 kg)   Height: 5' 3.2" (1.605 m)   PainSc: 0-No pain    Body mass index is 37.46 kg/m.  Wt Readings from Last 3 Encounters:  02/17/21 212 lb 12.8 oz (96.5 kg)  02/16/21 213 lb 12.8 oz (97 kg)  01/04/21 210 lb 6.4 oz (95.4 kg)    BP Readings from Last 3 Encounters:  02/17/21 136/78  02/16/21 137/78  02/08/21 135/70     Objective:  Physical Exam  Vitals and nursing note reviewed.  Constitutional:      Appearance: Normal appearance. She is obese.  HENT:     Head: Normocephalic and atraumatic.     Nose:     Comments: Masked     Mouth/Throat:     Comments: Masked  Eyes:     Extraocular Movements: Extraocular movements intact.  Cardiovascular:     Rate and Rhythm: Normal rate and regular rhythm.     Heart sounds: Normal heart sounds.  Pulmonary:     Effort: Pulmonary effort is normal.     Breath sounds: Normal breath sounds.  Musculoskeletal:     Cervical back: Normal range of motion.  Skin:    General: Skin is warm.  Neurological:     General: No focal deficit  present.     Mental Status: She is alert.  Psychiatric:        Mood and Affect: Mood normal.        Behavior: Behavior normal.        Assessment And Plan:     1. Essential hypertension Comments: Fair control. Goal BP<130/80. Encouraged to be more intentional with lifestyle changes. Will f/u in Jan 2023. Will add hibiscus tea and Mg supplementation.  2. Class 2 severe obesity due to excess calories with serious comorbidity and body mass index (BMI) of 37.0 to 37.9 in adult [facility]) Comments: She is encouraged to strive for BMI less than 30 to decrease cardiac risk. Advised to aim for at least 150 minutes of exercise per week.    Patient was given opportunity to ask questions. Patient verbalized understanding of the plan and was able to repeat key elements of the plan. All questions were answered to their satisfaction.   I, [name], MD, have reviewed all documentation for this visit. The documentation on 02/17/21 for the exam, diagnosis, procedures, and orders are all accurate and complete.   IF YOU HAVE BEEN REFERRED TO A SPECIALIST, IT MAY TAKE 1-2 WEEKS TO SCHEDULE/PROCESS THE REFERRAL. IF YOU HAVE NOT HEARD FROM US/SPECIALIST IN TWO WEEKS, PLEASE GIVE [place] A CALL AT [phone].   THE PATIENT IS ENCOURAGED TO PRACTICE SOCIAL DISTANCING DUE TO THE COVID-19 PANDEMIC.

## 2021-02-17 NOTE — Telephone Encounter (Signed)
Per 02/16/21 los - called and gave upcoming appointments - confirmed 

## 2021-02-17 NOTE — Patient Instructions (Addendum)
Hibiscus tea Magnesium glycinate  Hypertension, Adult Hypertension is another name for high blood pressure. High blood pressure forces your heart to work harder to pump blood. This can cause problems over time. There are two numbers in a blood pressure reading. There is a top number (systolic) over a bottom number (diastolic). It is best to have a blood pressure that is below 120/80. Healthy choices can help lower your blood pressure, or you may need medicine to help lower it. What are the causes? The cause of this condition is not known. Some conditions may be related to high blood pressure. What increases the risk? Smoking. Having type 2 diabetes mellitus, high cholesterol, or both. Not getting enough exercise or physical activity. Being overweight. Having too much fat, sugar, calories, or salt (sodium) in your diet. Drinking too much alcohol. Having long-term (chronic) kidney disease. Having a family history of high blood pressure. Age. Risk increases with age. Race. You may be at higher risk if you are African American. Gender. Men are at higher risk than women before age 24. After age 22, women are at higher risk than men. Having obstructive sleep apnea. Stress. What are the signs or symptoms? High blood pressure may not cause symptoms. Very high blood pressure (hypertensive crisis) may cause: Headache. Feelings of worry or nervousness (anxiety). Shortness of breath. Nosebleed. A feeling of being sick to your stomach (nausea). Throwing up (vomiting). Changes in how you see. Very bad chest pain. Seizures. How is this treated? This condition is treated by making healthy lifestyle changes, such as: Eating healthy foods. Exercising more. Drinking less alcohol. Your health care provider may prescribe medicine if lifestyle changes are not enough to get your blood pressure under control, and if: Your top number is above 130. Your bottom number is above 80. Your personal target  blood pressure may vary. Follow these instructions at home: Eating and drinking  If told, follow the DASH eating plan. To follow this plan: Fill one half of your plate at each meal with fruits and vegetables. Fill one fourth of your plate at each meal with whole grains. Whole grains include whole-wheat pasta, brown rice, and whole-grain bread. Eat or drink low-fat dairy products, such as skim milk or low-fat yogurt. Fill one fourth of your plate at each meal with low-fat (lean) proteins. Low-fat proteins include fish, chicken without skin, eggs, beans, and tofu. Avoid fatty meat, cured and processed meat, or chicken with skin. Avoid pre-made or processed food. Eat less than 1,500 mg of salt each day. Do not drink alcohol if: Your doctor tells you not to drink. You are pregnant, may be pregnant, or are planning to become pregnant. If you drink alcohol: Limit how much you use to: 0-1 drink a day for women. 0-2 drinks a day for men. Be aware of how much alcohol is in your drink. In the [place] one drink equals one 12 oz bottle of beer (355 mL), one 5 oz glass of wine (148 mL), or one 1 oz glass of hard liquor (44 mL). Lifestyle  Work with your doctor to stay at a healthy weight or to lose weight. Ask your doctor what the best weight is for you. Get at least 30 minutes of exercise most days of the week. This may include walking, swimming, or biking. Get at least 30 minutes of exercise that strengthens your muscles (resistance exercise) at least 3 days a week. This may include lifting weights or doing Pilates. Do not use any products that  contain nicotine or tobacco, such as cigarettes, e-cigarettes, and chewing tobacco. If you need help quitting, ask your doctor. Check your blood pressure at home as told by your doctor. Keep all follow-up visits as told by your doctor. This is important. Medicines Take over-the-counter and prescription medicines only as told by your doctor. Follow directions  carefully. Do not skip doses of blood pressure medicine. The medicine does not work as well if you skip doses. Skipping doses also puts you at risk for problems. Ask your doctor about side effects or reactions to medicines that you should watch for. Contact a doctor if you: Think you are having a reaction to the medicine you are taking. Have headaches that keep coming back (recurring). Feel dizzy. Have swelling in your ankles. Have trouble with your vision. Get help right away if you: Get a very bad headache. Start to feel mixed up (confused). Feel weak or numb. Feel faint. Have very bad pain in your: Chest. Belly (abdomen). Throw up more than once. Have trouble breathing. Summary Hypertension is another name for high blood pressure. High blood pressure forces your heart to work harder to pump blood. For most people, a normal blood pressure is less than 120/80. Making healthy choices can help lower blood pressure. If your blood pressure does not get lower with healthy choices, you may need to take medicine. This information is not intended to replace advice given to you by your health care provider. Make sure you discuss any questions you have with your health care provider. Document Revised: 12/05/2017 Document Reviewed: 12/05/2017 Elsevier Patient Education  [place].

## 2021-02-21 ENCOUNTER — Telehealth: Payer: Self-pay | Admitting: *Deleted

## 2021-02-21 NOTE — Telephone Encounter (Signed)
Per scheduling message Sarah - called and lvm for call back  to schedule (2) doses of IV Iron  

## 2021-02-23 ENCOUNTER — Telehealth: Payer: Self-pay | Admitting: Family

## 2021-03-09 ENCOUNTER — Inpatient Hospital Stay (HOSPITAL_BASED_OUTPATIENT_CLINIC_OR_DEPARTMENT_OTHER): Payer: 59

## 2021-03-09 ENCOUNTER — Other Ambulatory Visit: Payer: Self-pay

## 2021-03-09 VITALS — BP 120/67 | HR 87 | Resp 16

## 2021-03-09 DIAGNOSIS — D500 Iron deficiency anemia secondary to blood loss (chronic): Secondary | ICD-10-CM | POA: Diagnosis not present

## 2021-03-09 MED ORDER — SODIUM CHLORIDE 0.9 % IV SOLN: Freq: Once | INTRAVENOUS | Status: AC

## 2021-03-09 MED ORDER — SODIUM CHLORIDE 0.9 % IV SOLN
200.0000 mg | Freq: Once | INTRAVENOUS | Status: AC
  Administered 2021-03-09: 200 mg via INTRAVENOUS
  Filled 2021-03-09: qty 200

## 2021-03-09 NOTE — Patient Instructions (Signed)

## 2021-03-16 ENCOUNTER — Other Ambulatory Visit: Payer: Self-pay

## 2021-03-16 ENCOUNTER — Inpatient Hospital Stay: Payer: 59 | Attending: Hematology & Oncology

## 2021-03-16 VITALS — BP 128/79 | HR 84 | Temp 98.6°F | Resp 18

## 2021-03-16 DIAGNOSIS — D500 Iron deficiency anemia secondary to blood loss (chronic): Secondary | ICD-10-CM | POA: Insufficient documentation

## 2021-03-16 MED ORDER — SODIUM CHLORIDE 0.9 % IV SOLN: Freq: Once | INTRAVENOUS | Status: AC

## 2021-03-16 MED ORDER — SODIUM CHLORIDE 0.9 % IV SOLN
200.0000 mg | Freq: Once | INTRAVENOUS | Status: AC
  Administered 2021-03-16: 200 mg via INTRAVENOUS
  Filled 2021-03-16: qty 200

## 2021-03-16 NOTE — Patient Instructions (Signed)

## 2021-03-22 ENCOUNTER — Encounter: Payer: 59 | Admitting: Internal Medicine

## 2021-04-29 ENCOUNTER — Telehealth: Payer: Self-pay | Admitting: Family

## 2021-05-16 ENCOUNTER — Other Ambulatory Visit: Payer: Self-pay

## 2021-05-16 ENCOUNTER — Encounter: Payer: Self-pay | Admitting: Internal Medicine

## 2021-05-16 ENCOUNTER — Ambulatory Visit (INDEPENDENT_AMBULATORY_CARE_PROVIDER_SITE_OTHER): Payer: 59 | Admitting: Internal Medicine

## 2021-05-16 VITALS — BP 138/82 | HR 107 | Temp 98.6°F | Ht 63.2 in | Wt 211.6 lb

## 2021-05-16 DIAGNOSIS — I10 Essential (primary) hypertension: Secondary | ICD-10-CM

## 2021-05-16 DIAGNOSIS — Z1211 Encounter for screening for malignant neoplasm of colon: Secondary | ICD-10-CM

## 2021-05-16 DIAGNOSIS — R7309 Other abnormal glucose: Secondary | ICD-10-CM

## 2021-05-16 DIAGNOSIS — R000 Tachycardia, unspecified: Secondary | ICD-10-CM

## 2021-05-16 DIAGNOSIS — Z0000 Encounter for general adult medical examination without abnormal findings: Secondary | ICD-10-CM | POA: Diagnosis not present

## 2021-05-16 DIAGNOSIS — Z6837 Body mass index (BMI) 37.0-37.9, adult: Secondary | ICD-10-CM

## 2021-05-16 DIAGNOSIS — D500 Iron deficiency anemia secondary to blood loss (chronic): Secondary | ICD-10-CM

## 2021-05-16 DIAGNOSIS — E66812 Obesity, class 2: Secondary | ICD-10-CM

## 2021-05-16 LAB — POCT URINALYSIS DIPSTICK
Bilirubin, UA: NEGATIVE
Glucose, UA: NEGATIVE
Ketones, UA: NEGATIVE
Leukocytes, UA: NEGATIVE
Nitrite, UA: NEGATIVE
Protein, UA: NEGATIVE
Spec Grav, UA: 1.02 (ref 1.010–1.025)
Urobilinogen, UA: 0.2 E.U./dL
pH, UA: 7.5 (ref 5.0–8.0)

## 2021-05-16 MED ORDER — ALBUTEROL SULFATE HFA 108 (90 BASE) MCG/ACT IN AERS: 2.0000 | INHALATION_SPRAY | Freq: Four times a day (QID) | RESPIRATORY_TRACT | 3 refills | Status: DC | PRN

## 2021-05-16 MED ORDER — LEVOCETIRIZINE DIHYDROCHLORIDE 5 MG PO TABS: ORAL_TABLET | ORAL | 2 refills | Status: DC

## 2021-05-16 MED ORDER — AMLODIPINE BESYLATE 5 MG PO TABS: ORAL_TABLET | ORAL | 2 refills | Status: DC

## 2021-05-16 NOTE — Progress Notes (Signed)
Rich Brave [place]leton,acting as a Education administrator for [name], MD.,have documented all relevant documentation on the behalf of [name], MD,as directed by  [name], MD while in the presence of [name], MD.  This visit occurred during the SARS-CoV-2 public health emergency.  Safety protocols were in place, including screening questions prior to the visit, additional usage of staff PPE, and extensive cleaning of exam room while observing appropriate contact time as indicated for disinfecting solutions.  Subjective:     Patient ID: [name] , female    DOB: 1975/09/22 , 46 y.o.   MRN: [id number]   Chief Complaint  Patient presents with   Annual Exam    HPI  She is here today for a full physical examination. She is followed by Dr. Garwin Brothers for her GYN exams.  She has no specific concerns or complaints at this time. She reports compliance with meds. She denies headaches, chest pain and shortness of breath.   Hypertension This is a chronic problem. The current episode started more than 1 month ago. The problem has been gradually improving since onset. The problem is controlled. Pertinent negatives include no blurred vision, chest pain, headaches, PND or shortness of breath. Past treatments include calcium channel blockers. The current treatment provides moderate improvement.    Past Medical History:  Diagnosis Date   Fibroids      Family History  Problem Relation Age of Onset   Hypertension Mother    Hyperthyroidism Mother    Prostate cancer Maternal Grandfather      Current Outpatient Medications:    cyclobenzaprine (FLEXERIL) 10 MG tablet, Take 1 tablet (10 mg total) by mouth 3 (three) times daily as needed for muscle spasms., Disp: 30 tablet, Rfl: 0   folic acid (FOLVITE) 1 MG tablet, Take 1 tablet (1 mg total) by mouth daily., Disp: 30 tablet, Rfl: 6   Multiple Vitamin (MULTIVITAMIN) tablet, Take 1 tablet by mouth daily., Disp: , Rfl:    SODIUM FLUORIDE  5000 SENSITIVE 1.1-5 % GEL, Take by mouth daily at 6 (six) AM., Disp: , Rfl:    albuterol (VENTOLIN HFA) 108 (90 Base) MCG/ACT inhaler, Inhale 2 puffs into the lungs every 6 (six) hours as needed for wheezing or shortness of breath., Disp: 18 g, Rfl: 3   amLODipine (NORVASC) 5 MG tablet, TAKE 1 TABLET(5 MG) BY MOUTH AT BEDTIME, Disp: 90 tablet, Rfl: 2   levocetirizine (XYZAL) 5 MG tablet, TAKE 1 TABLET BY MOUTH EVERY DAY IN THE EVENING, Disp: 90 tablet, Rfl: 2   No Known Allergies    The patient states she uses none for birth control. Last LMP was Patient's last menstrual period was 04/17/2021.. Negative for Dysmenorrhea. Negative for: breast discharge, breast lump(s), breast pain and breast self exam. Associated symptoms include abnormal vaginal bleeding. Pertinent negatives include abnormal bleeding (hematology), anxiety, decreased libido, depression, difficulty falling sleep, dyspareunia, history of infertility, nocturia, sexual dysfunction, sleep disturbances, urinary incontinence, urinary urgency, vaginal discharge and vaginal itching. Diet regular.The patient states her exercise level is  intermittent.  . The patient's tobacco use is:  Social History   Tobacco Use  Smoking Status Never  Smokeless Tobacco Never  . She has been exposed to passive smoke. The patient's alcohol use is:  Social History   Substance and Sexual Activity  Alcohol Use Yes   Alcohol/week: 2.0 standard drinks   Types: 2 Glasses of wine per week   Review of Systems  Constitutional: Negative.   HENT:  Negative.    Eyes: Negative.  Negative for blurred vision.  Respiratory: Negative.  Negative for shortness of breath.   Cardiovascular: Negative.  Negative for chest pain and PND.  Gastrointestinal: Negative.   Endocrine: Negative.   Genitourinary: Negative.   Musculoskeletal: Negative.   Skin: Negative.   Allergic/Immunologic: Negative.   Neurological: Negative.  Negative for headaches.  Hematological:  Negative.   Psychiatric/Behavioral: Negative.      Today's Vitals   05/16/21 1421  BP: 138/82  Pulse: (!) 107  Temp: 98.6 F (37 C)  Weight: 211 lb 9.6 oz (96 kg)  Height: 5' 3.2" (1.605 m)  PainSc: 0-No pain   Body mass index is 37.25 kg/m.  Wt Readings from Last 3 Encounters:  05/16/21 211 lb 9.6 oz (96 kg)  02/17/21 212 lb 12.8 oz (96.5 kg)  02/16/21 213 lb 12.8 oz (97 kg)    Objective:  Physical Exam Vitals and nursing note reviewed.  Constitutional:      Appearance: Normal appearance. She is obese.  HENT:     Head: Normocephalic and atraumatic.     Right Ear: Tympanic membrane, ear canal and external ear normal.     Left Ear: Tympanic membrane, ear canal and external ear normal.     Nose:     Comments: Masked     Mouth/Throat:     Comments: Masked  Eyes:     Extraocular Movements: Extraocular movements intact.     Conjunctiva/sclera: Conjunctivae normal.     Pupils: Pupils are equal, round, and reactive to light.  Cardiovascular:     Rate and Rhythm: Normal rate and regular rhythm.     Pulses: Normal pulses.          Dorsalis pedis pulses are 2+ on the right side and 2+ on the left side.     Heart sounds: Normal heart sounds.  Pulmonary:     Effort: Pulmonary effort is normal.     Breath sounds: Normal breath sounds.  Chest:  Breasts:    Tanner Score is 5.     Right: Normal.     Left: Normal.  Abdominal:     General: Bowel sounds are normal.     Palpations: Abdomen is soft.  Genitourinary:    Comments: deferred Musculoskeletal:        General: Normal range of motion.     Cervical back: Normal range of motion and neck supple.  Skin:    General: Skin is warm and dry.  Neurological:     General: No focal deficit present.     Mental Status: She is alert and oriented to person, place, and time.  Psychiatric:        Mood and Affect: Mood normal.        Behavior: Behavior normal.        Assessment And Plan:     1. Encounter for general adult  medical examination w/o abnormal findings Comments: A full exam was performed. Importance of monthly self breast exams was discussed with the patient. I will refer her to GI for CRC screening. PATIENT IS ADVISED TO GET 30-45 MINUTES REGULAR EXERCISE NO LESS THAN FOUR TO FIVE DAYS PER WEEK - BOTH WEIGHTBEARING EXERCISES AND AEROBIC ARE RECOMMENDED.  PATIENT IS ADVISED TO FOLLOW A HEALTHY DIET WITH AT LEAST SIX FRUITS/VEGGIES PER DAY, DECREASE INTAKE OF RED MEAT, AND TO INCREASE FISH INTAKE TO TWO DAYS PER WEEK.  MEATS/FISH SHOULD NOT BE FRIED, BAKED OR BROILED IS PREFERABLE.  IT IS  ALSO IMPORTANT TO CUT BACK ON YOUR SUGAR INTAKE. PLEASE AVOID ANYTHING WITH ADDED SUGAR, CORN SYRUP OR OTHER SWEETENERS. IF YOU MUST USE A SWEETENER, YOU CAN TRY STEVIA. IT IS ALSO IMPORTANT TO AVOID ARTIFICIALLY SWEETENERS AND DIET BEVERAGES. LASTLY, I SUGGEST WEARING SPF 50 SUNSCREEN ON EXPOSED PARTS AND ESPECIALLY WHEN IN THE DIRECT SUNLIGHT FOR AN EXTENDED PERIOD OF TIME.  PLEASE AVOID FAST FOOD RESTAURANTS AND INCREASE YOUR WATER INTAKE.  - CBC - CMP14+EGFR - Lipid panel - Iron, TIBC and Ferritin Panel - Ambulatory referral to Gastroenterology  2. Essential hypertension Comments: Chronic, fair control. Goal BP<130/80.  No med changes. EKG performed, ST w/o acute changes. Advised to follow low sodium diet. - POCT Urinalysis Dipstick (81002) - Microalbumin / Creatinine Urine Ratio - EKG 12-Lead - CBC - CMP14+EGFR  3. Other abnormal glucose Comments: Her a1c has been elevated in the past. I will recheck this today. Encouraged to avoid sugary beverages, including diet drinks.  - Hemoglobin A1c  4. Iron deficiency anemia due to chronic blood loss Comments: She has had iron infusions. I will check CBC and iron panel today.  - CBC - Iron, TIBC and Ferritin Panel  5. Sinus tachycardia by electrocardiogram Comments: HR 110 on EKG. She is encouraged to decrease caffeine intake and increase water intake. May also  benefit from nightly Mg supplementation.   6. Class 2 severe obesity due to excess calories with serious comorbidity and body mass index (BMI) of 37.0 to 37.9 in adult [facility]) Comments: She is encouraged to strive for BMI less than 30 to decrease cardiac risk. Advised to aim for at least 150 minutes of exercise per week.   7. Screening for colon cancer Comments: I will refer her to GI for CRC screening.  - Ambulatory referral to Gastroenterology  Patient was given opportunity to ask questions. Patient verbalized understanding of the plan and was able to repeat key elements of the plan. All questions were answered to their satisfaction.   I, [name], MD, have reviewed all documentation for this visit. The documentation on 05/21/21 for the exam, diagnosis, procedures, and orders are all accurate and complete.   THE PATIENT IS ENCOURAGED TO PRACTICE SOCIAL DISTANCING DUE TO THE COVID-19 PANDEMIC.

## 2021-05-16 NOTE — Patient Instructions (Signed)

## 2021-05-17 LAB — CMP14+EGFR
ALT: 16 IU/L (ref 0–32)
AST: 16 IU/L (ref 0–40)
Albumin/Globulin Ratio: 1.5 (ref 1.2–2.2)
Albumin: 4.7 g/dL (ref 3.8–4.8)
Alkaline Phosphatase: 88 IU/L (ref 44–121)
BUN/Creatinine Ratio: 11 (ref 9–23)
BUN: 10 mg/dL (ref 6–24)
Bilirubin Total: 0.3 mg/dL (ref 0.0–1.2)
CO2: 25 mmol/L (ref 20–29)
Calcium: 9.4 mg/dL (ref 8.7–10.2)
Chloride: 102 mmol/L (ref 96–106)
Creatinine, Ser: 0.89 mg/dL (ref 0.57–1.00)
Globulin, Total: 3.1 g/dL (ref 1.5–4.5)
Glucose: 106 mg/dL — ABNORMAL HIGH (ref 70–99)
Potassium: 4.3 mmol/L (ref 3.5–5.2)
Sodium: 141 mmol/L (ref 134–144)
Total Protein: 7.8 g/dL (ref 6.0–8.5)
eGFR: 81 mL/min/{1.73_m2} (ref >59)

## 2021-05-17 LAB — IRON,TIBC AND FERRITIN PANEL
Ferritin: 60 ng/mL (ref 15–150)
Iron Saturation: 25 % (ref 15–55)
Iron: 85 ug/dL (ref 27–159)
Total Iron Binding Capacity: 343 ug/dL (ref 250–450)
UIBC: 258 ug/dL (ref 131–425)

## 2021-05-17 LAB — CBC
Hematocrit: 44.2 % (ref 34.0–46.6)
Hemoglobin: 14.4 g/dL (ref 11.1–15.9)
MCH: 27.4 pg (ref 26.6–33.0)
MCHC: 32.6 g/dL (ref 31.5–35.7)
MCV: 84 fL (ref 79–97)
Platelets: 292 10*3/uL (ref 150–450)
RBC: 5.25 x10E6/uL (ref 3.77–5.28)
RDW: 13.2 % (ref 11.7–15.4)
WBC: 7.3 10*3/uL (ref 3.4–10.8)

## 2021-05-17 LAB — MICROALBUMIN / CREATININE URINE RATIO
Creatinine, Urine: 136.4 mg/dL
Microalb/Creat Ratio: 7 mg/g creat (ref 0–29)
Microalbumin, Urine: 9 ug/mL

## 2021-05-17 LAB — LIPID PANEL
Chol/HDL Ratio: 4.1 ratio (ref 0.0–4.4)
Cholesterol, Total: 179 mg/dL (ref 100–199)
HDL: 44 mg/dL (ref >39)
LDL Chol Calc (NIH): 113 mg/dL — ABNORMAL HIGH (ref 0–99)
Triglycerides: 123 mg/dL (ref 0–149)
VLDL Cholesterol Cal: 22 mg/dL (ref 5–40)

## 2021-05-17 LAB — HEMOGLOBIN A1C
Est. average glucose Bld gHb Est-mCnc: 123 mg/dL
Hgb A1c MFr Bld: 5.9 % — ABNORMAL HIGH (ref 4.8–5.6)

## 2021-05-20 ENCOUNTER — Other Ambulatory Visit: Payer: 59

## 2021-05-20 ENCOUNTER — Ambulatory Visit: Payer: 59 | Admitting: Family

## 2021-05-30 ENCOUNTER — Encounter: Payer: Self-pay | Admitting: Internal Medicine

## 2021-05-31 ENCOUNTER — Other Ambulatory Visit: Payer: Self-pay

## 2021-05-31 NOTE — Telephone Encounter (Signed)
Prior auth done for wegovy 0.5mg , waiting on a response from the pt's insurance.

## 2021-06-02 ENCOUNTER — Telehealth: Payer: Self-pay

## 2021-06-02 ENCOUNTER — Other Ambulatory Visit: Payer: Self-pay

## 2021-06-02 MED ORDER — WEGOVY 0.5 MG/0.5ML ~~LOC~~ SOAJ
0.5000 mg | SUBCUTANEOUS | 0 refills | Status: DC
Start: 2021-06-02 — End: 2021-07-25

## 2021-06-02 NOTE — Telephone Encounter (Signed)
I called patient and left her vm to call the office. Her wegovy has been approved through 12/29/21 and she needs a f/u appt. YL,RMA

## 2021-06-24 ENCOUNTER — Encounter: Payer: Self-pay | Admitting: Internal Medicine

## 2021-06-27 ENCOUNTER — Other Ambulatory Visit: Payer: Self-pay

## 2021-06-27 MED ORDER — ALBUTEROL SULFATE HFA 108 (90 BASE) MCG/ACT IN AERS: 2.0000 | INHALATION_SPRAY | Freq: Four times a day (QID) | RESPIRATORY_TRACT | 3 refills | Status: DC | PRN

## 2021-06-29 ENCOUNTER — Other Ambulatory Visit: Payer: Self-pay

## 2021-06-29 IMAGING — DX DG CHEST 1V PORT
1 series · 1 of 1 positions shown · non-contrast
Comparison: None.

CLINICAL DATA: Fever, chills, and cough.  MHFTV-8F exposure.

EXAM:
PORTABLE CHEST 1 VIEW

[chest ap]
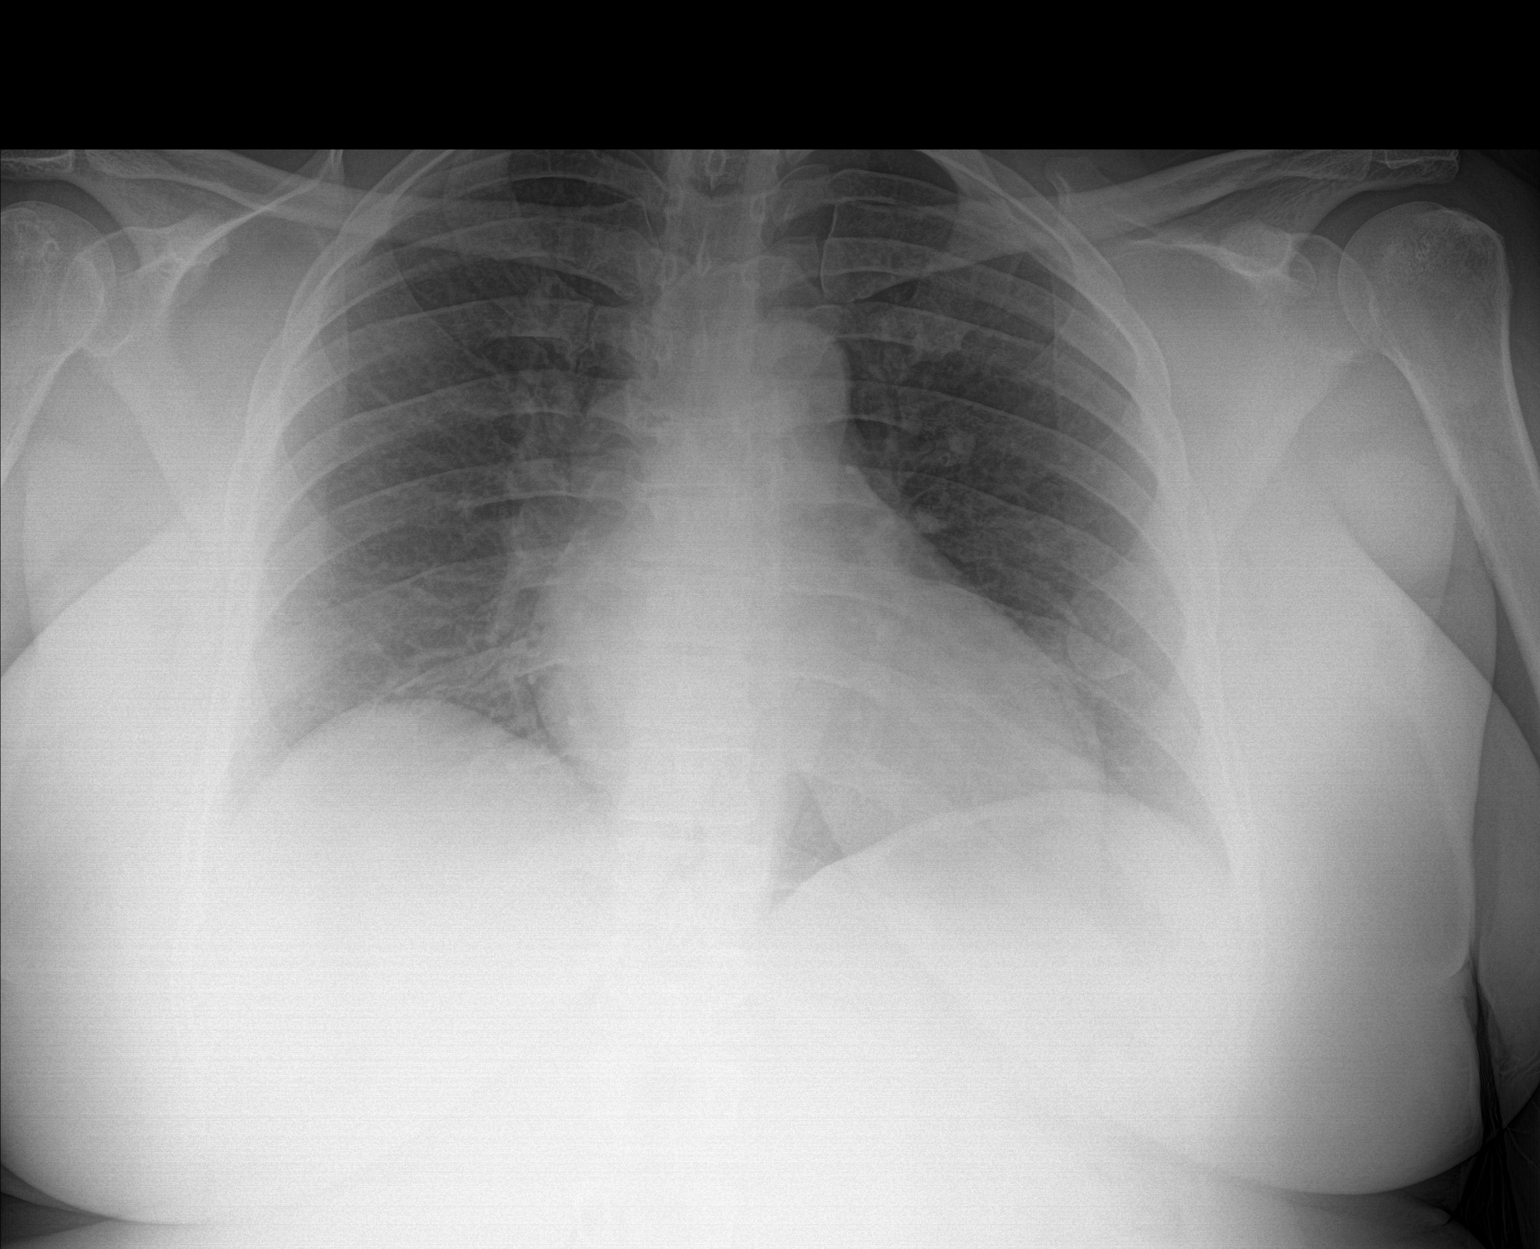

[1 of 1 positions shown; findings below may reference images not displayed]

FINDINGS: The heart size and mediastinal contours are within normal limits.
Both lungs are clear. The visualized skeletal structures are
unremarkable.
IMPRESSION: No active disease.

## 2021-06-29 MED ORDER — WEGOVY 1 MG/0.5ML ~~LOC~~ SOAJ: 1.0000 mg | SUBCUTANEOUS | 0 refills | Status: DC

## 2021-07-05 ENCOUNTER — Other Ambulatory Visit: Payer: Self-pay

## 2021-07-05 ENCOUNTER — Ambulatory Visit (AMBULATORY_SURGERY_CENTER): Payer: 59 | Admitting: *Deleted

## 2021-07-05 VITALS — Ht 63.0 in | Wt 203.0 lb

## 2021-07-05 DIAGNOSIS — Z1211 Encounter for screening for malignant neoplasm of colon: Secondary | ICD-10-CM

## 2021-07-05 MED ORDER — NA SULFATE-K SULFATE-MG SULF 17.5-3.13-1.6 GM/177ML PO SOLN: 1.0000 | Freq: Once | ORAL | 0 refills | Status: AC

## 2021-07-05 NOTE — Progress Notes (Signed)

## 2021-07-18 ENCOUNTER — Encounter: Payer: Self-pay | Admitting: Gastroenterology

## 2021-07-25 ENCOUNTER — Ambulatory Visit (INDEPENDENT_AMBULATORY_CARE_PROVIDER_SITE_OTHER): Payer: 59 | Admitting: Internal Medicine

## 2021-07-25 ENCOUNTER — Encounter: Payer: Self-pay | Admitting: Internal Medicine

## 2021-07-25 VITALS — BP 122/80 | HR 91 | Temp 98.2°F | Ht 63.8 in | Wt 205.6 lb

## 2021-07-25 DIAGNOSIS — Z6835 Body mass index (BMI) 35.0-35.9, adult: Secondary | ICD-10-CM | POA: Diagnosis not present

## 2021-07-25 DIAGNOSIS — I10 Essential (primary) hypertension: Secondary | ICD-10-CM | POA: Diagnosis not present

## 2021-07-25 MED ORDER — WEGOVY 1 MG/0.5ML ~~LOC~~ SOAJ: 1.0000 mg | SUBCUTANEOUS | 0 refills | Status: DC

## 2021-07-25 NOTE — Patient Instructions (Signed)
Hypertension, Adult ?Hypertension is another name for high blood pressure. High blood pressure forces your heart to work harder to pump blood. This can cause problems over time. ?There are two numbers in a blood pressure reading. There is a top number (systolic) over a bottom number (diastolic). It is best to have a blood pressure that is below 120/80. ?What are the causes? ?The cause of this condition is not known. Some other conditions can lead to high blood pressure. ?What increases the risk? ?Some lifestyle factors can make you more likely to develop high blood pressure: ?Smoking. ?Not getting enough exercise or physical activity. ?Being overweight. ?Having too much fat, sugar, calories, or salt (sodium) in your diet. ?Drinking too much alcohol. ?Other risk factors include: ?Having any of these conditions: ?Heart disease. ?Diabetes. ?High cholesterol. ?Kidney disease. ?Obstructive sleep apnea. ?Having a family history of high blood pressure and high cholesterol. ?Age. The risk increases with age. ?Stress. ?What are the signs or symptoms? ?High blood pressure may not cause symptoms. Very high blood pressure (hypertensive crisis) may cause: ?Headache. ?Fast or uneven heartbeats (palpitations). ?Shortness of breath. ?Nosebleed. ?Vomiting or feeling like you may vomit (nauseous). ?Changes in how you see. ?Very bad chest pain. ?Feeling dizzy. ?Seizures. ?How is this treated? ?This condition is treated by making healthy lifestyle changes, such as: ?Eating healthy foods. ?Exercising more. ?Drinking less alcohol. ?Your doctor may prescribe medicine if lifestyle changes do not help enough and if: ?Your top number is above 130. ?Your bottom number is above 80. ?Your personal target blood pressure may vary. ?Follow these instructions at home: ?Eating and drinking ? ?If told, follow the DASH eating plan. To follow this plan: ?Fill one half of your plate at each meal with fruits and vegetables. ?Fill one fourth of your plate  at each meal with whole grains. Whole grains include whole-wheat pasta, brown rice, and whole-grain bread. ?Eat or drink low-fat dairy products, such as skim milk or low-fat yogurt. ?Fill one fourth of your plate at each meal with low-fat (lean) proteins. Low-fat proteins include fish, chicken without skin, eggs, beans, and tofu. ?Avoid fatty meat, cured and processed meat, or chicken with skin. ?Avoid pre-made or processed food. ?Limit the amount of salt in your diet to less than 1,500 mg each day. ?Do not drink alcohol if: ?Your doctor tells you not to drink. ?You are pregnant, may be pregnant, or are planning to become pregnant. ?If you drink alcohol: ?Limit how much you have to: ?0-1 drink a day for women. ?0-2 drinks a day for men. ?Know how much alcohol is in your drink. In the [place] one drink equals one 12 oz bottle of beer (355 mL), one 5 oz glass of wine (148 mL), or one 1? oz glass of hard liquor (44 mL). ?Lifestyle ? ?Work with your doctor to stay at a healthy weight or to lose weight. Ask your doctor what the best weight is for you. ?Get at least 30 minutes of exercise that causes your heart to beat faster (aerobic exercise) most days of the week. This may include walking, swimming, or biking. ?Get at least 30 minutes of exercise that strengthens your muscles (resistance exercise) at least 3 days a week. This may include lifting weights or doing Pilates. ?Do not smoke or use any products that contain nicotine or tobacco. If you need help quitting, ask your doctor. ?Check your blood pressure at home as told by your doctor. ?Keep all follow-up visits. ?Medicines ?Take over-the-counter and prescription medicines   only as told by your doctor. Follow directions carefully. ?Do not skip doses of blood pressure medicine. The medicine does not work as well if you skip doses. Skipping doses also puts you at risk for problems. ?Ask your doctor about side effects or reactions to medicines that you should watch  for. ?Contact a doctor if: ?You think you are having a reaction to the medicine you are taking. ?You have headaches that keep coming back. ?You feel dizzy. ?You have swelling in your ankles. ?You have trouble with your vision. ?Get help right away if: ?You get a very bad headache. ?You start to feel mixed up (confused). ?You feel weak or numb. ?You feel faint. ?You have very bad pain in your: ?Chest. ?Belly (abdomen). ?You vomit more than once. ?You have trouble breathing. ?These symptoms may be an emergency. Get help right away. Call 911. ?Do not wait to see if the symptoms will go away. ?Do not drive yourself to the hospital. ?Summary ?Hypertension is another name for high blood pressure. ?High blood pressure forces your heart to work harder to pump blood. ?For most people, a normal blood pressure is less than 120/80. ?Making healthy choices can help lower blood pressure. If your blood pressure does not get lower with healthy choices, you may need to take medicine. ?This information is not intended to replace advice given to you by your health care provider. Make sure you discuss any questions you have with your health care provider. ?Document Revised: 01/13/2021 Document Reviewed: 01/13/2021 ?Elsevier Patient Education ? 2023 [employer] ? ?

## 2021-07-25 NOTE — Progress Notes (Signed)
?Rich Brave Llittleton,acting as a Education administrator for [name], MD.,have documented all relevant documentation on the behalf of [name], MD,as directed by  [name], MD while in the presence of [name], MD.  ?This visit occurred during the SARS-CoV-2 public health emergency.  Safety protocols were in place, including screening questions prior to the visit, additional usage of staff PPE, and extensive cleaning of exam room while observing appropriate contact time as indicated for disinfecting solutions. ? ?Subjective:  ?  ? Patient ID: [name] , female    DOB: October 26, 1975 , 46 y.o.   MRN: [id number] ? ? ?Chief Complaint  ?Patient presents with  ? Weight Check  ? Hypertension  ? ? ?HPI ? ?Patient is here today for blood pressure and weight check. She has been compliant with her medications. She denies headaches, chest pain and shortness of breath.  ? ?Hypertension ?This is a chronic problem. The current episode started more than 1 month ago. The problem has been gradually improving since onset. The problem is controlled. Pertinent negatives include no blurred vision, chest pain, headaches, PND or shortness of breath. Past treatments include calcium channel blockers. The current treatment provides moderate improvement.   ? ?Past Medical History:  ?Diagnosis Date  ? Allergy   ? seasonal  ? Anemia   ? Fibroids   ? Hypertension   ? Sleep apnea   ? "mild",no cpap  ?  ? ?Family History  ?Problem Relation Age of Onset  ? Hypertension Mother   ? Hyperthyroidism Mother   ? Prostate cancer Maternal Grandfather   ? Colon cancer Neg Hx   ? Colon polyps Neg Hx   ? Celiac disease Neg Hx   ? Crohn's disease Neg Hx   ? Esophageal cancer Neg Hx   ? Rectal cancer Neg Hx   ? Stomach cancer Neg Hx   ? ? ? ?Current Outpatient Medications:  ?  albuterol (VENTOLIN HFA) 108 (90 Base) MCG/ACT inhaler, Inhale 2 puffs into the lungs every 6 (six) hours as needed for wheezing or shortness of breath., Disp: 18 g, Rfl:  3 ?  amLODipine (NORVASC) 5 MG tablet, TAKE 1 TABLET(5 MG) BY MOUTH AT BEDTIME, Disp: 90 tablet, Rfl: 2 ?  Blood Pressure Monitoring (BLOOD PRESSURE DIGITAL SOLN) KIT, , Disp: , Rfl:  ?  cyclobenzaprine (FLEXERIL) 10 MG tablet, Take 1 tablet (10 mg total) by mouth 3 (three) times daily as needed for muscle spasms., Disp: 30 tablet, Rfl: 0 ?  folic acid (FOLVITE) 1 MG tablet, Take 1 tablet (1 mg total) by mouth daily., Disp: 30 tablet, Rfl: 6 ?  ibuprofen (ADVIL) 800 MG tablet, Take 800 mg by mouth every 6 (six) hours as needed., Disp: , Rfl:  ?  levocetirizine (XYZAL) 5 MG tablet, TAKE 1 TABLET BY MOUTH EVERY DAY IN THE EVENING (Patient taking differently: as needed. TAKE 1 TABLET BY MOUTH EVERY DAY IN THE EVENING), Disp: 90 tablet, Rfl: 2 ?  Multiple Vitamin (MULTIVITAMIN) tablet, Take 1 tablet by mouth daily., Disp: , Rfl:  ?  SODIUM FLUORIDE 5000 SENSITIVE 1.1-5 % GEL, Take by mouth daily at 6 (six) AM., Disp: , Rfl:  ?  Semaglutide-Weight Management (WEGOVY) 1 MG/0.5ML SOAJ, Inject 1 mg into the skin once a week., Disp: 2 mL, Rfl: 0  ? ?No Known Allergies  ? ?Review of Systems  ?Constitutional: Negative.   ?Eyes:  Negative for blurred vision.  ?Respiratory: Negative.  Negative for shortness of breath.   ?  Cardiovascular: Negative.  Negative for chest pain and PND.  ?Gastrointestinal: Negative.   ?Neurological: Negative.  Negative for headaches.  ?Psychiatric/Behavioral: Negative.     ? ?Today's Vitals  ? 07/25/21 0943  ?BP: 122/80  ?Pulse: 91  ?Temp: 98.2 ?F (36.8 ?C)  ?Weight: 205 lb 9.6 oz (93.3 kg)  ?Height: 5' 3.8" (1.621 m)  ?PainSc: 0-No pain  ? ?Body mass index is 35.51 kg/m?.  ?Wt Readings from Last 3 Encounters:  ?07/25/21 205 lb 9.6 oz (93.3 kg)  ?07/05/21 203 lb (92.1 kg)  ?05/16/21 211 lb 9.6 oz (96 kg)  ?  ?Objective:  ?Physical Exam ?Vitals and nursing note reviewed.  ?Constitutional:   ?   Appearance: Normal appearance.  ?HENT:  ?   Head: Normocephalic and atraumatic.  ?   Nose:  ?   Comments:  Masked  ?   Mouth/Throat:  ?   Comments: Masked  ?Cardiovascular:  ?   Rate and Rhythm: Normal rate and regular rhythm.  ?   Heart sounds: Normal heart sounds.  ?Pulmonary:  ?   Effort: Pulmonary effort is normal.  ?   Breath sounds: Normal breath sounds.  ?Skin: ?   General: Skin is warm.  ?Neurological:  ?   General: No focal deficit present.  ?   Mental Status: She is alert.  ?Psychiatric:     ?   Mood and Affect: Mood normal.     ?   Behavior: Behavior normal.  ?   ?Assessment And Plan:  ?   ?1. Essential hypertension ?Comments: Well controlled, no med changes. She will c/w amlodipine daily.  ? ?2. Class 2 severe obesity due to excess calories with serious comorbidity and body mass index (BMI) of 35.0 to 35.9 in adult [facility]) ?Comments: She was congratulated on her 6 lb weight loss since Feb 2023. She will c/w WEgovy 74m weekly and increase after second month of 128m She is encouraged to aim for at least 150 minutes of exercise per week. F/u 8 wks.  ?  ?Patient was given opportunity to ask questions. Patient verbalized understanding of the plan and was able to repeat key elements of the plan. All questions were answered to their satisfaction.  ? ?I, RoMaximino GreenlandMD, have reviewed all documentation for this visit. The documentation on 07/25/21 for the exam, diagnosis, procedures, and orders are all accurate and complete.  ? ?IF YOU HAVE BEEN REFERRED TO A SPECIALIST, IT MAY TAKE 1-2 WEEKS TO SCHEDULE/PROCESS THE REFERRAL. IF YOU HAVE NOT HEARD FROM US/SPECIALIST IN TWO WEEKS, PLEASE GIVE [place] CALL AT [phone] X [phone].  ? ?THE PATIENT IS ENCOURAGED TO PRACTICE SOCIAL DISTANCING DUE TO THE COVID-19 PANDEMIC.   ?

## 2021-07-26 ENCOUNTER — Encounter: Payer: Self-pay | Admitting: Gastroenterology

## 2021-07-26 ENCOUNTER — Ambulatory Visit (AMBULATORY_SURGERY_CENTER): Payer: 59 | Admitting: Gastroenterology

## 2021-07-26 VITALS — BP 107/62 | HR 82 | Temp 98.1°F | Resp 12 | Ht 63.0 in | Wt 203.0 lb

## 2021-07-26 DIAGNOSIS — D123 Benign neoplasm of transverse colon: Secondary | ICD-10-CM

## 2021-07-26 DIAGNOSIS — Z1211 Encounter for screening for malignant neoplasm of colon: Secondary | ICD-10-CM | POA: Diagnosis present

## 2021-07-26 DIAGNOSIS — K5140 Inflammatory polyps of colon without complications: Secondary | ICD-10-CM | POA: Diagnosis not present

## 2021-07-26 DIAGNOSIS — K621 Rectal polyp: Secondary | ICD-10-CM | POA: Diagnosis not present

## 2021-07-26 DIAGNOSIS — D128 Benign neoplasm of rectum: Secondary | ICD-10-CM

## 2021-07-26 MED ORDER — SODIUM CHLORIDE 0.9 % IV SOLN: 500.0000 mL | Freq: Once | INTRAVENOUS | Status: DC

## 2021-07-26 NOTE — Progress Notes (Signed)
PT taken to PACU. Monitors in place. VSS. Report given to RN. 

## 2021-07-26 NOTE — Progress Notes (Signed)
? ?GASTROENTEROLOGY PROCEDURE H&P NOTE  ? ?Primary Care Physician: ?Glendale Chard, MD ? ? ? ?Reason for Procedure:  Colon Cancer screening ? ?Plan:    Colonoscopy ? ?Patient is appropriate for endoscopic procedure(s) in the ambulatory ([place]) setting. ? ?The nature of the procedure, as well as the risks, benefits, and alternatives were carefully and thoroughly reviewed with the patient. Ample time for discussion and questions allowed. The patient understood, was satisfied, and agreed to proceed.  ? ? ? ?HPI: ?[name] is a 46 y.o. female who presents for colonoscopy for routine Colon Cancer screening.  No active GI symptoms.  No known family history of colon cancer or related malignancy.  Patient is otherwise without complaints or active issues today. ? ?Past Medical History:  ?Diagnosis Date  ? Allergy   ? seasonal  ? Anemia   ? Fibroids   ? Hypertension   ? Sleep apnea   ? "mild",no cpap  ? ? ?Past Surgical History:  ?Procedure Laterality Date  ? BILATERAL SALPINGECTOMY  2013  ? along with myomectomy  ? IR GENERIC HISTORICAL  11/18/2015  ? IR RADIOLOGIST EVAL & MGMT 11/18/2015 Sandi Mariscal, MD [facility] INTERV RAD  ? ROBOT ASSISTED MYOMECTOMY  2013  ? for uterine fibroids; along with salpingectomy  ? ? ?Prior to Admission medications   ?Medication Sig Start Date End Date Taking? Authorizing Provider  ?albuterol (VENTOLIN HFA) 108 (90 Base) MCG/ACT inhaler Inhale 2 puffs into the lungs every 6 (six) hours as needed for wheezing or shortness of breath. 06/27/21  Yes Glendale Chard, MD  ?amLODipine (NORVASC) 5 MG tablet TAKE 1 TABLET(5 MG) BY MOUTH AT BEDTIME 05/16/21  Yes Glendale Chard, MD  ?folic acid (FOLVITE) 1 MG tablet Take 1 tablet (1 mg total) by mouth daily. 01/18/21  Yes Celso Amy, NP  ?levocetirizine (XYZAL) 5 MG tablet TAKE 1 TABLET BY MOUTH EVERY DAY IN THE EVENING ?Patient taking differently: as needed. TAKE 1 TABLET BY MOUTH EVERY DAY IN THE EVENING 05/16/21  Yes Glendale Chard, MD  ?Multiple  Vitamin (MULTIVITAMIN) tablet Take 1 tablet by mouth daily.   Yes [provider]  ?Blood Pressure Monitoring (BLOOD PRESSURE DIGITAL SOLN) KIT  04/29/21   [provider]  ?cyclobenzaprine (FLEXERIL) 10 MG tablet Take 1 tablet (10 mg total) by mouth 3 (three) times daily as needed for muscle spasms. 02/27/19   Glendale Chard, MD  ?ibuprofen (ADVIL) 800 MG tablet Take 800 mg by mouth every 6 (six) hours as needed. 03/22/21   [provider]  ?Semaglutide-Weight Management (WEGOVY) 1 MG/0.5ML SOAJ Inject 1 mg into the skin once a week. 07/25/21   Glendale Chard, MD  ?SODIUM FLUORIDE 5000 SENSITIVE 1.1-5 % GEL Take by mouth daily at 6 (six) AM. 01/30/21   [provider]  ? ? ?Current Outpatient Medications  ?Medication Sig Dispense Refill  ? albuterol (VENTOLIN HFA) 108 (90 Base) MCG/ACT inhaler Inhale 2 puffs into the lungs every 6 (six) hours as needed for wheezing or shortness of breath. 18 g 3  ? amLODipine (NORVASC) 5 MG tablet TAKE 1 TABLET(5 MG) BY MOUTH AT BEDTIME 90 tablet 2  ? folic acid (FOLVITE) 1 MG tablet Take 1 tablet (1 mg total) by mouth daily. 30 tablet 6  ? levocetirizine (XYZAL) 5 MG tablet TAKE 1 TABLET BY MOUTH EVERY DAY IN THE EVENING (Patient taking differently: as needed. TAKE 1 TABLET BY MOUTH EVERY DAY IN THE EVENING) 90 tablet 2  ? Multiple Vitamin (MULTIVITAMIN) tablet  Take 1 tablet by mouth daily.    ? Blood Pressure Monitoring (BLOOD PRESSURE DIGITAL SOLN) KIT     ? cyclobenzaprine (FLEXERIL) 10 MG tablet Take 1 tablet (10 mg total) by mouth 3 (three) times daily as needed for muscle spasms. 30 tablet 0  ? ibuprofen (ADVIL) 800 MG tablet Take 800 mg by mouth every 6 (six) hours as needed.    ? Semaglutide-Weight Management (WEGOVY) 1 MG/0.5ML SOAJ Inject 1 mg into the skin once a week. 2 mL 0  ? SODIUM FLUORIDE 5000 SENSITIVE 1.1-5 % GEL Take by mouth daily at 6 (six) AM.    ? ?Current Facility-Administered Medications  ?Medication Dose Route Frequency  Provider Last Rate Last Admin  ? 0.9 %  sodium chloride infusion  500 mL Intravenous Once Rorie Delmore V, DO      ? ? ?Allergies as of 07/26/2021  ? (No Known Allergies)  ? ? ?Family History  ?Problem Relation Age of Onset  ? Hypertension Mother   ? Hyperthyroidism Mother   ? Prostate cancer Maternal Grandfather   ? Colon cancer Neg Hx   ? Colon polyps Neg Hx   ? Celiac disease Neg Hx   ? Crohn's disease Neg Hx   ? Esophageal cancer Neg Hx   ? Rectal cancer Neg Hx   ? Stomach cancer Neg Hx   ? ? ?Social History  ? ?Socioeconomic History  ? Marital status: Single  ?  Spouse name: Not on file  ? Number of children: Not on file  ? Years of education: Not on file  ? Highest education level: Not on file  ?Occupational History  ? Not on file  ?Tobacco Use  ? Smoking status: Never  ?  Passive exposure: Never  ? Smokeless tobacco: Never  ?Vaping Use  ? Vaping Use: Never used  ?Substance and Sexual Activity  ? Alcohol use: Yes  ?  Alcohol/week: 2.0 standard drinks  ?  Types: 2 Glasses of wine per week  ?  Comment: liquor 2 x a week  ? Drug use: No  ? Sexual activity: Yes  ?  Birth control/protection: None  ?Other Topics Concern  ? Not on file  ?Social History Narrative  ? Not on file  ? ?Social Determinants of Health  ? ?Financial Resource Strain: Not on file  ?Food Insecurity: Not on file  ?Transportation Needs: Not on file  ?Physical Activity: Not on file  ?Stress: Not on file  ?Social Connections: Not on file  ?Intimate Partner Violence: Not on file  ? ? ?Physical Exam: ?Vital signs in last 24 hours: ?@BP  (!) 162/84   Pulse 87   Temp 98.1 ?F (36.7 ?C)   Ht 5' 3"  (1.6 m)   Wt 203 lb (92.1 kg)   LMP 07/20/2021 (Exact Date)   SpO2 100%   BMI 35.96 kg/m?  ?GEN: NAD ?EYE: Sclerae anicteric ?ENT: MMM ?CV: Non-tachycardic ?Pulm: CTA b/l ?GI: Soft, NT/ND ?NEURO:  Alert & Oriented x 3 ? ? ?Gerrit Heck, DO ?[facility] ? ? ?07/26/2021 9:24 AM ? ?

## 2021-07-26 NOTE — Op Note (Signed)
[place] ?Patient Name: [name] ?Procedure Date: 07/26/2021 9:31 AM ?MRN: [id number] ?Endoscopist: Gerrit Heck , MD ?Age: 46 ?Referring MD:  ?Date of Birth: November 16, 1975 ?Gender: Female ?Account #: [id number] ?Procedure:                Colonoscopy ?Indications:              Screening for colorectal malignant neoplasm, This  ?                          is the patient's first colonoscopy ?Medicines:                Monitored Anesthesia Care ?Procedure:                Pre-Anesthesia Assessment: ?                          - Prior to the procedure, a History and Physical  ?                          was performed, and patient medications and  ?                          allergies were reviewed. The patient's tolerance of  ?                          previous anesthesia was also reviewed. The risks  ?                          and benefits of the procedure and the sedation  ?                          options and risks were discussed with the patient.  ?                          All questions were answered, and informed consent  ?                          was obtained. Prior Anticoagulants: The patient has  ?                          taken no previous anticoagulant or antiplatelet  ?                          agents. ASA Grade Assessment: II - A patient with  ?                          mild systemic disease. After reviewing the risks  ?                          and benefits, the patient was deemed in  ?                          satisfactory condition to undergo the procedure. ?  After obtaining informed consent, the colonoscope  ?                          was passed under direct vision. Throughout the  ?                          procedure, the patient's blood pressure, pulse, and  ?                          oxygen saturations were monitored continuously. The  ?                          CF [id number] #[id number] was introduced through the anus  ?                          and advanced to the  the cecum, identified by  ?                          appendiceal orifice and ileocecal valve. The  ?                          colonoscopy was performed without difficulty. The  ?                          patient tolerated the procedure well. The quality  ?                          of the bowel preparation was excellent. The  ?                          ileocecal valve, appendiceal orifice, and rectum  ?                          were photographed. ?Scope In: 9:40:18 AM ?Scope Out: 9:57:35 AM ?Scope Withdrawal Time: 0 hours 13 minutes 35 seconds  ?Total Procedure Duration: 0 hours 17 minutes 17 seconds  ?Findings:                 Small skin tag was found on perianal exam. ?                          A 30 mm polyp was found in the transverse colon.  ?                          The polyp was pedunculated. The polyp was removed  ?                          with a hot snare. Resection was complete and polyp  ?                          removed en bloc. Estimated blood loss: none. ?                          A 4 mm polyp was found  in the rectum. The polyp was  ?                          sessile. The polyp was removed with a cold snare.  ?                          Resection and retrieval were complete. Estimated  ?                          blood loss was minimal. ?                          The retroflexed view of the distal rectum and anal  ?                          verge was normal and showed no anal or rectal  ?                          abnormalities. ?Complications:            No immediate complications. ?Estimated Blood Loss:     Estimated blood loss was minimal. ?Impression:               - Perianal skin tags found on perianal exam. ?                          - One 30 mm polyp in the transverse colon, removed  ?                          with a hot snare. Resected and retrieved en bloc. ?                          - One 4 mm polyp in the rectum, removed with a cold  ?                          snare. Resected and retrieved. ?                           - The distal rectum and anal verge are normal on  ?                          retroflexion view. ?Recommendation:           - Patient has a contact number available for  ?                          emergencies. The signs and symptoms of potential  ?                          delayed complications were discussed with the  ?                          patient. Return to normal activities tomorrow.  ?  Written discharge instructions were provided to the  ?                          patient. ?                          - Resume previous diet. ?                          - Continue present medications. ?                          - Await pathology results. ?                          - Repeat colonoscopy in 3 years for surveillance,  ?                          or sooner based on pathology results. ?                          - Return to GI office PRN. ?Gerrit Heck, MD ?07/26/2021 10:03:31 AM ?

## 2021-07-26 NOTE — Progress Notes (Signed)
Called to room to assist during endoscopic procedure.  Patient ID and intended procedure confirmed with present staff. Received instructions for my participation in the procedure from the performing physician.  

## 2021-07-26 NOTE — Patient Instructions (Signed)
Handout provided on polyps.   YOU HAD AN ENDOSCOPIC PROCEDURE TODAY AT THE [facility]:   Refer to the procedure report that was given to you for any specific questions about what was found during the examination.  If the procedure report does not answer your questions, please call your gastroenterologist to clarify.  If you requested that your care partner not be given the details of your procedure findings, then the procedure report has been included in a sealed envelope for you to review at your convenience later.  YOU SHOULD EXPECT: Some feelings of bloating in the abdomen. Passage of more gas than usual.  Walking can help get rid of the air that was put into your GI tract during the procedure and reduce the bloating. If you had a lower endoscopy (such as a colonoscopy or flexible sigmoidoscopy) you may notice spotting of blood in your stool or on the toilet paper. If you underwent a bowel prep for your procedure, you may not have a normal bowel movement for a few days.  Please Note:  You might notice some irritation and congestion in your nose or some drainage.  This is from the oxygen used during your procedure.  There is no need for concern and it should clear up in a day or so.  SYMPTOMS TO REPORT IMMEDIATELY:  Following lower endoscopy (colonoscopy or flexible sigmoidoscopy):  Excessive amounts of blood in the stool  Significant tenderness or worsening of abdominal pains  Swelling of the abdomen that is new, acute  Fever of 100F or higher  For urgent or emergent issues, a gastroenterologist can be reached at any hour by calling [phone]. Do not use MyChart messaging for urgent concerns.    DIET:  We do recommend a small meal at first, but then you may proceed to your regular diet.  Drink plenty of fluids but you should avoid alcoholic beverages for 24 hours.  ACTIVITY:  You should plan to take it easy for the rest of today and you should NOT DRIVE or use heavy  machinery until tomorrow (because of the sedation medicines used during the test).    FOLLOW UP: Our staff will call the number listed on your records 48-72 hours following your procedure to check on you and address any questions or concerns that you may have regarding the information given to you following your procedure. If we do not reach you, we will leave a message.  We will attempt to reach you two times.  During this call, we will ask if you have developed any symptoms of COVID 19. If you develop any symptoms (ie: fever, flu-like symptoms, shortness of breath, cough etc.) before then, please call [phone].  If you test positive for Covid 19 in the 2 weeks post procedure, please call and report this information to us.    If any biopsies were taken you will be contacted by phone or by letter within the next 1-3 weeks.  Please call us at [phone] if you have not heard about the biopsies in 3 weeks.    SIGNATURES/CONFIDENTIALITY: You and/or your care partner have signed paperwork which will be entered into your electronic medical record.  These signatures attest to the fact that that the information above on your After Visit Summary has been reviewed and is understood.  Full responsibility of the confidentiality of this discharge information lies with you and/or your care-partner.  

## 2021-07-26 NOTE — Progress Notes (Signed)
VS-CW  Pt's states no medical or surgical changes since previsit or office visit.  

## 2021-07-28 ENCOUNTER — Telehealth: Payer: Self-pay | Admitting: *Deleted

## 2021-07-28 NOTE — Telephone Encounter (Signed)
?  Follow up Call- ? ? ?  07/26/2021  ?  8:52 AM  ?Call back number  ?Post procedure Call Back phone  # [phone]  ?Permission to leave phone message Yes  ?  ? ?Patient questions: ? ? ?Message left to call us if necessary. ?

## 2021-07-28 NOTE — Telephone Encounter (Signed)
?  Follow up Call- ? ? ?  07/26/2021  ?  8:52 AM  ?Call back number  ?Post procedure Call Back phone  # [phone]  ?Permission to leave phone message Yes  ?  ? ?Patient questions: ? ?Do you have a fever, pain , or abdominal swelling? No. ?Pain Score  0 * ? ?Have you tolerated food without any problems? Yes.   ? ?Have you been able to return to your normal activities? Yes.   ? ?Do you have any questions about your discharge instructions: ?Diet   No. ?Medications  No. ?Follow up visit  No. ? ?Do you have questions or concerns about your Care? No. ? ?Actions: ?* If pain score is 4 or above: ?No action needed, pain <4. ? ? ?

## 2021-08-01 ENCOUNTER — Encounter: Payer: Self-pay | Admitting: Gastroenterology

## 2021-08-03 ENCOUNTER — Encounter: Payer: Self-pay | Admitting: Gastroenterology

## 2021-09-02 ENCOUNTER — Other Ambulatory Visit: Payer: Self-pay

## 2021-09-02 ENCOUNTER — Encounter: Payer: Self-pay | Admitting: Internal Medicine

## 2021-09-02 MED ORDER — WEGOVY 1.7 MG/0.75ML ~~LOC~~ SOAJ: 1.7000 mg | SUBCUTANEOUS | 0 refills | Status: DC

## 2021-09-27 ENCOUNTER — Encounter: Payer: Self-pay | Admitting: Internal Medicine

## 2021-09-27 ENCOUNTER — Ambulatory Visit (INDEPENDENT_AMBULATORY_CARE_PROVIDER_SITE_OTHER): Payer: 59 | Admitting: Internal Medicine

## 2021-09-27 VITALS — BP 114/76 | HR 88 | Temp 98.5°F | Ht 63.0 in | Wt 192.8 lb

## 2021-09-27 DIAGNOSIS — Z6834 Body mass index (BMI) 34.0-34.9, adult: Secondary | ICD-10-CM | POA: Diagnosis not present

## 2021-09-27 DIAGNOSIS — E6609 Other obesity due to excess calories: Secondary | ICD-10-CM | POA: Diagnosis not present

## 2021-09-27 DIAGNOSIS — I10 Essential (primary) hypertension: Secondary | ICD-10-CM

## 2021-09-27 MED ORDER — WEGOVY 1.7 MG/0.75ML ~~LOC~~ SOAJ: 1.7000 mg | SUBCUTANEOUS | 0 refills | Status: DC

## 2021-09-27 NOTE — Progress Notes (Signed)
[name],acting as a Education administrator for Heather Greenland, MD.,have documented all relevant documentation on the behalf of Heather Greenland, MD,as directed by  Heather Greenland, MD while in the presence of Heather Greenland, MD.  This visit occurred during the SARS-CoV-2 public health emergency.  Safety protocols were in place, including screening questions prior to the visit, additional usage of staff PPE, and extensive cleaning of exam room while observing appropriate contact time as indicated for disinfecting solutions.  Subjective:     Patient ID: [name] , female    DOB: [birth year] , 46 y.o.   MRN: [id number]   Chief Complaint  Patient presents with   Weight Check    HPI  Patient presents today for a weight check. Patient is tolerating is [name] and is doing very well on it. She would like to go to the next dose. She has no other concerns at this time.   Hypertension This is a chronic problem. The current episode started more than 1 month ago. The problem has been gradually improving since onset. The problem is controlled. Pertinent negatives include no blurred vision, chest pain, headaches, PND or shortness of breath. Past treatments include calcium channel blockers. The current treatment provides moderate improvement.     Past Medical History:  Diagnosis Date   Allergy    seasonal   Anemia    Fibroids    Hypertension    Sleep apnea    "mild",no cpap     Family History  Problem Relation Age of Onset   Hypertension Mother    Hyperthyroidism Mother    Prostate cancer Maternal Grandfather    Colon cancer Neg Hx    Colon polyps Neg Hx    Celiac disease Neg Hx    Crohn's disease Neg Hx    Esophageal cancer Neg Hx    Rectal cancer Neg Hx    Stomach cancer Neg Hx      Current Outpatient Medications:    albuterol (VENTOLIN HFA) 108 (90 Base) MCG/ACT inhaler, Inhale 2 puffs into the lungs every 6 (six) hours as needed for wheezing or shortness of breath., Disp: 18  g, Rfl: 3   amLODipine (NORVASC) 5 MG tablet, TAKE 1 TABLET(5 MG) BY MOUTH AT BEDTIME, Disp: 90 tablet, Rfl: 2   Blood Pressure Monitoring (BLOOD PRESSURE DIGITAL SOLN) KIT, , Disp: , Rfl:    cyclobenzaprine (FLEXERIL) 10 MG tablet, Take 1 tablet (10 mg total) by mouth 3 (three) times daily as needed for muscle spasms., Disp: 30 tablet, Rfl: 0   folic acid (FOLVITE) 1 MG tablet, Take 1 tablet (1 mg total) by mouth daily., Disp: 30 tablet, Rfl: 6   ibuprofen (ADVIL) 800 MG tablet, Take 800 mg by mouth every 6 (six) hours as needed., Disp: , Rfl:    levocetirizine (XYZAL) 5 MG tablet, TAKE 1 TABLET BY MOUTH EVERY DAY IN THE EVENING (Patient taking differently: as needed. TAKE 1 TABLET BY MOUTH EVERY DAY IN THE EVENING), Disp: 90 tablet, Rfl: 2   Multiple Vitamin (MULTIVITAMIN) tablet, Take 1 tablet by mouth daily., Disp: , Rfl:    SODIUM FLUORIDE 5000 SENSITIVE 1.1-5 % GEL, Take by mouth daily at 6 (six) AM., Disp: , Rfl:    Semaglutide-Weight Management (WEGOVY) 1.7 MG/0.75ML SOAJ, Inject 1.7 mg into the skin once a week., Disp: 3 mL, Rfl: 0   Semaglutide-Weight Management (WEGOVY) 2.4 MG/0.75ML SOAJ, Inject 2.4 mg into the skin once a week., Disp: 3 mL, Rfl: 0  No Known Allergies   Review of Systems  Constitutional: Negative.   Eyes:  Negative for blurred vision.  Respiratory: Negative.  Negative for shortness of breath.   Cardiovascular: Negative.  Negative for chest pain and PND.  Gastrointestinal: Negative.   Neurological: Negative.  Negative for headaches.  Psychiatric/Behavioral: Negative.       Today's Vitals   09/27/21 1156  BP: 114/76  Pulse: 88  Temp: 98.5 F (36.9 C)  Weight: 192 lb 12.8 oz (87.5 kg)  Height: _0  (1.6 m)  PainSc: 0-No pain   Body mass index is 34.15 kg/m.  Wt Readings from Last 3 Encounters:  09/27/21 192 lb 12.8 oz (87.5 kg)  07/26/21 203 lb (92.1 kg)  07/25/21 205 lb 9.6 oz (93.3 kg)     Objective:  Physical Exam Vitals and nursing note  reviewed.  Constitutional:      Appearance: Normal appearance.  HENT:     Head: Normocephalic and atraumatic.  Eyes:     Extraocular Movements: Extraocular movements intact.  Cardiovascular:     Rate and Rhythm: Normal rate and regular rhythm.     Heart sounds: Normal heart sounds.  Pulmonary:     Effort: Pulmonary effort is normal.     Breath sounds: Normal breath sounds.  Musculoskeletal:     Cervical back: Normal range of motion.  Skin:    General: Skin is warm.  Neurological:     General: No focal deficit present.     Mental Status: She is alert.  Psychiatric:        Mood and Affect: Mood normal.        Behavior: Behavior normal.      Assessment And Plan:     1. Class 1 obesity due to excess calories with serious comorbidity and body mass index (BMI) of 34.0 to 34.9 in adult Comments: She was congratulated on her 11lb weight loss since April 2023. [name] will send rx Wegovy 1.28m weekly. [name] will f/u in 8 weeks.  2. Essential hypertension Comments: Chronic, well controlled. She will c/w amlodipine. She is encouraged to follow low sodium diet.      Patient was given opportunity to ask questions. Patient verbalized understanding of the plan and was able to repeat key elements of the plan. All questions were answered to their satisfaction.   [name], Heather Greenland MD, have reviewed all documentation for this visit. The documentation on 09/27/21 for the exam, diagnosis, procedures, and orders are all accurate and complete.   IF YOU HAVE BEEN REFERRED TO A SPECIALIST, IT MAY TAKE 1-2 WEEKS TO SCHEDULE/PROCESS THE REFERRAL. IF YOU HAVE NOT HEARD FROM US/SPECIALIST IN TWO WEEKS, PLEASE GIVE [place] CALL AT [phone].   THE PATIENT IS ENCOURAGED TO PRACTICE SOCIAL DISTANCING DUE TO THE COVID-19 PANDEMIC.

## 2021-09-27 NOTE — Patient Instructions (Signed)

## 2021-10-05 ENCOUNTER — Encounter: Payer: Self-pay | Admitting: Internal Medicine

## 2021-10-10 ENCOUNTER — Other Ambulatory Visit: Payer: Self-pay

## 2021-10-10 MED ORDER — WEGOVY 2.4 MG/0.75ML ~~LOC~~ SOAJ: 2.4000 mg | SUBCUTANEOUS | 0 refills | Status: DC

## 2021-10-26 ENCOUNTER — Other Ambulatory Visit: Payer: Self-pay | Admitting: Internal Medicine

## 2021-10-26 DIAGNOSIS — Z1231 Encounter for screening mammogram for malignant neoplasm of breast: Secondary | ICD-10-CM

## 2021-10-31 ENCOUNTER — Other Ambulatory Visit: Payer: Self-pay | Admitting: Internal Medicine

## 2021-11-01 MED ORDER — WEGOVY 2.4 MG/0.75ML ~~LOC~~ SOAJ: 2.4000 mg | SUBCUTANEOUS | 0 refills | Status: DC

## 2021-11-17 ENCOUNTER — Other Ambulatory Visit (HOSPITAL_COMMUNITY): Payer: Self-pay

## 2021-11-22 ENCOUNTER — Ambulatory Visit (INDEPENDENT_AMBULATORY_CARE_PROVIDER_SITE_OTHER): Payer: 59 | Admitting: Internal Medicine

## 2021-11-22 ENCOUNTER — Encounter: Payer: Self-pay | Admitting: Internal Medicine

## 2021-11-22 ENCOUNTER — Ambulatory Visit
Admission: RE | Admit: 2021-11-22 | Discharge: 2021-11-22 | Disposition: A | Discharge status: Home / Self Care | Payer: 59 | Source: Ambulatory Visit | Attending: Internal Medicine | Admitting: Internal Medicine

## 2021-11-22 ENCOUNTER — Encounter: Payer: Self-pay | Admitting: Family

## 2021-11-22 VITALS — BP 122/78 | HR 90 | Temp 98.1°F | Ht 63.0 in | Wt 189.0 lb

## 2021-11-22 DIAGNOSIS — Z6833 Body mass index (BMI) 33.0-33.9, adult: Secondary | ICD-10-CM | POA: Diagnosis not present

## 2021-11-22 DIAGNOSIS — E6609 Other obesity due to excess calories: Secondary | ICD-10-CM | POA: Diagnosis not present

## 2021-11-22 DIAGNOSIS — Z1231 Encounter for screening mammogram for malignant neoplasm of breast: Secondary | ICD-10-CM

## 2021-11-22 DIAGNOSIS — I10 Essential (primary) hypertension: Secondary | ICD-10-CM

## 2021-11-22 DIAGNOSIS — R7309 Other abnormal glucose: Secondary | ICD-10-CM | POA: Diagnosis not present

## 2021-11-22 LAB — BMP8+EGFR
BUN/Creatinine Ratio: 10 (ref 9–23)
BUN: 9 mg/dL (ref 6–24)
CO2: 22 mmol/L (ref 20–29)
Calcium: 9.4 mg/dL (ref 8.7–10.2)
Chloride: 102 mmol/L (ref 96–106)
Creatinine, Ser: 0.89 mg/dL (ref 0.57–1.00)
Glucose: 81 mg/dL (ref 70–99)
Potassium: 4.7 mmol/L (ref 3.5–5.2)
Sodium: 142 mmol/L (ref 134–144)
eGFR: 81 mL/min/{1.73_m2} (ref >59)

## 2021-11-22 LAB — HEMOGLOBIN A1C
Est. average glucose Bld gHb Est-mCnc: 111 mg/dL
Hgb A1c MFr Bld: 5.5 % (ref 4.8–5.6)

## 2021-11-22 MED ORDER — WEGOVY 2.4 MG/0.75ML ~~LOC~~ SOAJ: 2.4000 mg | SUBCUTANEOUS | 3 refills | Status: DC

## 2021-11-22 NOTE — Progress Notes (Signed)
I,[name],acting as a scribe for Heather Greenland, MD.,have documented all relevant documentation on the behalf of Heather Greenland, MD,as directed by  Heather Greenland, MD while in the presence of Heather Greenland, MD.    Subjective:     Patient ID: [name] , female    DOB: [birth year] , 46 y.o.   MRN: [id number]   Chief Complaint  Patient presents with   Hypertension    HPI  Patient is here today for blood pressure and weight check. She has been compliant with her medications. She denies headaches, chest pain and shortness of breath.   Hypertension This is a chronic problem. The current episode started more than 1 month ago. The problem has been gradually improving since onset. The problem is controlled. Pertinent negatives include no blurred vision, chest pain, headaches, PND or shortness of breath. Past treatments include calcium channel blockers. The current treatment provides moderate improvement.     Past Medical History:  Diagnosis Date   Allergy    seasonal   Anemia    Fibroids    Hypertension    Sleep apnea    "mild",no cpap     Family History  Problem Relation Age of Onset   Hypertension Mother    Hyperthyroidism Mother    Prostate cancer Maternal Grandfather    Colon cancer Neg Hx    Colon polyps Neg Hx    Celiac disease Neg Hx    Crohn's disease Neg Hx    Esophageal cancer Neg Hx    Rectal cancer Neg Hx    Stomach cancer Neg Hx      Current Outpatient Medications:    albuterol (VENTOLIN HFA) 108 (90 Base) MCG/ACT inhaler, Inhale 2 puffs into the lungs every 6 (six) hours as needed for wheezing or shortness of breath., Disp: 18 g, Rfl: 3   amLODipine (NORVASC) 5 MG tablet, TAKE 1 TABLET(5 MG) BY MOUTH AT BEDTIME, Disp: 90 tablet, Rfl: 2   Blood Pressure Monitoring (BLOOD PRESSURE DIGITAL SOLN) KIT, , Disp: , Rfl:    cyclobenzaprine (FLEXERIL) 10 MG tablet, Take 1 tablet (10 mg total) by mouth 3 (three) times daily as needed for muscle  spasms., Disp: 30 tablet, Rfl: 0   folic acid (FOLVITE) 1 MG tablet, Take 1 tablet (1 mg total) by mouth daily., Disp: 30 tablet, Rfl: 6   ibuprofen (ADVIL) 800 MG tablet, Take 800 mg by mouth every 6 (six) hours as needed., Disp: , Rfl:    levocetirizine (XYZAL) 5 MG tablet, TAKE 1 TABLET BY MOUTH EVERY DAY IN THE EVENING (Patient taking differently: as needed. TAKE 1 TABLET BY MOUTH EVERY DAY IN THE EVENING), Disp: 90 tablet, Rfl: 2   Multiple Vitamin (MULTIVITAMIN) tablet, Take 1 tablet by mouth daily., Disp: , Rfl:    SODIUM FLUORIDE 5000 SENSITIVE 1.1-5 % GEL, Take by mouth daily at 6 (six) AM., Disp: , Rfl:    Semaglutide-Weight Management (WEGOVY) 2.4 MG/0.75ML SOAJ, Inject 2.4 mg into the skin once a week., Disp: 3 mL, Rfl: 3   No Known Allergies   Review of Systems  Constitutional: Negative.   Eyes:  Negative for blurred vision.  Respiratory: Negative.  Negative for shortness of breath.   Cardiovascular: Negative.  Negative for chest pain and PND.  Gastrointestinal: Negative.   Neurological: Negative.  Negative for headaches.  Psychiatric/Behavioral: Negative.       Today's Vitals   11/22/21 0822  BP: 122/78  Pulse: 90  Temp: 98.1  F (36.7 C)  SpO2: 96%  Weight: 189 lb (85.7 kg)  Height: _0  (1.6 m)  PainSc: 0-No pain   Body mass index is 33.48 kg/m.  Wt Readings from Last 3 Encounters:  11/22/21 189 lb (85.7 kg)  09/27/21 192 lb 12.8 oz (87.5 kg)  07/26/21 203 lb (92.1 kg)    Objective:  Physical Exam Vitals and nursing note reviewed.  Constitutional:      Appearance: Normal appearance.  HENT:     Head: Normocephalic and atraumatic.  Eyes:     Extraocular Movements: Extraocular movements intact.  Cardiovascular:     Rate and Rhythm: Normal rate and regular rhythm.     Heart sounds: Normal heart sounds.  Pulmonary:     Effort: Pulmonary effort is normal.     Breath sounds: Normal breath sounds.  Musculoskeletal:     Cervical back: Normal range of  motion.  Skin:    General: Skin is warm.  Neurological:     General: No focal deficit present.     Mental Status: She is alert.  Psychiatric:        Mood and Affect: Mood normal.        Behavior: Behavior normal.      Assessment And Plan:     1. Essential hypertension Comments: Chronic, well controlled. She will c/w amlodipine.  - BMP8+EGFR  2. Other abnormal glucose Comments: Her a1c has been elevated in the past. I will recheck this today. She is encouraged to limit her intake of sweetened beverages, including diet drinks.  - Hemoglobin A1c  3. Class 1 obesity due to excess calories without serious comorbidity with body mass index (BMI) of 33.0 to 33.9 in adult Comments: She has lost 3 lbs since June 2023. She is within 9 lbs of her initial goal weight of 180. She will c/w [facility] 2.26m weekly, f/u in 8-10 weeks.  Patient was given opportunity to ask questions. Patient verbalized understanding of the plan and was able to repeat key elements of the plan. All questions were answered to their satisfaction.   I, RMaximino Greenland MD, have reviewed all documentation for this visit. The documentation on 11/22/21 for the exam, diagnosis, procedures, and orders are all accurate and complete.   IF YOU HAVE BEEN REFERRED TO A SPECIALIST, IT MAY TAKE 1-2 WEEKS TO SCHEDULE/PROCESS THE REFERRAL. IF YOU HAVE NOT HEARD FROM US/SPECIALIST IN TWO WEEKS, PLEASE GIVE [place] CALL AT [phone].   THE PATIENT IS ENCOURAGED TO PRACTICE SOCIAL DISTANCING DUE TO THE COVID-19 PANDEMIC.

## 2021-11-22 NOTE — Patient Instructions (Signed)
Hypertension, Adult ?Hypertension is another name for high blood pressure. High blood pressure forces your heart to work harder to pump blood. This can cause problems over time. ?There are two numbers in a blood pressure reading. There is a top number (systolic) over a bottom number (diastolic). It is best to have a blood pressure that is below 120/80. ?What are the causes? ?The cause of this condition is not known. Some other conditions can lead to high blood pressure. ?What increases the risk? ?Some lifestyle factors can make you more likely to develop high blood pressure: ?Smoking. ?Not getting enough exercise or physical activity. ?Being overweight. ?Having too much fat, sugar, calories, or salt (sodium) in your diet. ?Drinking too much alcohol. ?Other risk factors include: ?Having any of these conditions: ?Heart disease. ?Diabetes. ?High cholesterol. ?Kidney disease. ?Obstructive sleep apnea. ?Having a family history of high blood pressure and high cholesterol. ?Age. The risk increases with age. ?Stress. ?What are the signs or symptoms? ?High blood pressure may not cause symptoms. Very high blood pressure (hypertensive crisis) may cause: ?Headache. ?Fast or uneven heartbeats (palpitations). ?Shortness of breath. ?Nosebleed. ?Vomiting or feeling like you may vomit (nauseous). ?Changes in how you see. ?Very bad chest pain. ?Feeling dizzy. ?Seizures. ?How is this treated? ?This condition is treated by making healthy lifestyle changes, such as: ?Eating healthy foods. ?Exercising more. ?Drinking less alcohol. ?Your doctor may prescribe medicine if lifestyle changes do not help enough and if: ?Your top number is above 130. ?Your bottom number is above 80. ?Your personal target blood pressure may vary. ?Follow these instructions at home: ?Eating and drinking ? ?If told, follow the DASH eating plan. To follow this plan: ?Fill one half of your plate at each meal with fruits and vegetables. ?Fill one fourth of your plate  at each meal with whole grains. Whole grains include whole-wheat pasta, brown rice, and whole-grain bread. ?Eat or drink low-fat dairy products, such as skim milk or low-fat yogurt. ?Fill one fourth of your plate at each meal with low-fat (lean) proteins. Low-fat proteins include fish, chicken without skin, eggs, beans, and tofu. ?Avoid fatty meat, cured and processed meat, or chicken with skin. ?Avoid pre-made or processed food. ?Limit the amount of salt in your diet to less than 1,500 mg each day. ?Do not drink alcohol if: ?Your doctor tells you not to drink. ?You are pregnant, may be pregnant, or are planning to become pregnant. ?If you drink alcohol: ?Limit how much you have to: ?0-1 drink a day for women. ?0-2 drinks a day for men. ?Know how much alcohol is in your drink. In the [place] one drink equals one 12 oz bottle of beer (355 mL), one 5 oz glass of wine (148 mL), or one 1? oz glass of hard liquor (44 mL). ?Lifestyle ? ?Work with your doctor to stay at a healthy weight or to lose weight. Ask your doctor what the best weight is for you. ?Get at least 30 minutes of exercise that causes your heart to beat faster (aerobic exercise) most days of the week. This may include walking, swimming, or biking. ?Get at least 30 minutes of exercise that strengthens your muscles (resistance exercise) at least 3 days a week. This may include lifting weights or doing Pilates. ?Do not smoke or use any products that contain nicotine or tobacco. If you need help quitting, ask your doctor. ?Check your blood pressure at home as told by your doctor. ?Keep all follow-up visits. ?Medicines ?Take over-the-counter and prescription medicines   only as told by your doctor. Follow directions carefully. ?Do not skip doses of blood pressure medicine. The medicine does not work as well if you skip doses. Skipping doses also puts you at risk for problems. ?Ask your doctor about side effects or reactions to medicines that you should watch  for. ?Contact a doctor if: ?You think you are having a reaction to the medicine you are taking. ?You have headaches that keep coming back. ?You feel dizzy. ?You have swelling in your ankles. ?You have trouble with your vision. ?Get help right away if: ?You get a very bad headache. ?You start to feel mixed up (confused). ?You feel weak or numb. ?You feel faint. ?You have very bad pain in your: ?Chest. ?Belly (abdomen). ?You vomit more than once. ?You have trouble breathing. ?These symptoms may be an emergency. Get help right away. Call 911. ?Do not wait to see if the symptoms will go away. ?Do not drive yourself to the hospital. ?Summary ?Hypertension is another name for high blood pressure. ?High blood pressure forces your heart to work harder to pump blood. ?For most people, a normal blood pressure is less than 120/80. ?Making healthy choices can help lower blood pressure. If your blood pressure does not get lower with healthy choices, you may need to take medicine. ?This information is not intended to replace advice given to you by your health care provider. Make sure you discuss any questions you have with your health care provider. ?Document Revised: 01/13/2021 Document Reviewed: 01/13/2021 ?Elsevier Patient Education ? 2023 [employer] ? ?

## 2021-12-22 ENCOUNTER — Other Ambulatory Visit: Payer: Self-pay | Admitting: Internal Medicine

## 2022-01-09 ENCOUNTER — Encounter: Payer: Self-pay | Admitting: Internal Medicine

## 2022-01-09 ENCOUNTER — Other Ambulatory Visit: Payer: Self-pay

## 2022-01-09 MED ORDER — WEGOVY 2.4 MG/0.75ML ~~LOC~~ SOAJ: 2.4000 mg | SUBCUTANEOUS | 3 refills | Status: DC

## 2022-01-31 ENCOUNTER — Encounter: Payer: Self-pay | Admitting: Internal Medicine

## 2022-01-31 ENCOUNTER — Ambulatory Visit (INDEPENDENT_AMBULATORY_CARE_PROVIDER_SITE_OTHER): Payer: 59 | Admitting: Internal Medicine

## 2022-01-31 VITALS — BP 120/84 | HR 74 | Temp 98.4°F | Ht 63.0 in | Wt 185.0 lb

## 2022-01-31 DIAGNOSIS — E6609 Other obesity due to excess calories: Secondary | ICD-10-CM | POA: Diagnosis not present

## 2022-01-31 DIAGNOSIS — I10 Essential (primary) hypertension: Secondary | ICD-10-CM

## 2022-01-31 DIAGNOSIS — Z23 Encounter for immunization: Secondary | ICD-10-CM | POA: Diagnosis not present

## 2022-01-31 DIAGNOSIS — Z6832 Body mass index (BMI) 32.0-32.9, adult: Secondary | ICD-10-CM | POA: Diagnosis not present

## 2022-01-31 MED ORDER — WEGOVY 2.4 MG/0.75ML ~~LOC~~ SOAJ: 2.4000 mg | SUBCUTANEOUS | 3 refills | Status: DC

## 2022-01-31 MED ORDER — AMLODIPINE BESYLATE 5 MG PO TABS: ORAL_TABLET | ORAL | 2 refills | Status: DC

## 2022-01-31 NOTE — Patient Instructions (Signed)

## 2022-01-31 NOTE — Progress Notes (Signed)
Rich Brave [place],acting as a Education administrator for [name], MD.,have documented all relevant documentation on the behalf of [name], MD,as directed by  [name], MD while in the presence of [name], MD.    Subjective:     Patient ID: [name] , female    DOB: Feb 02, 1976 , 46 y.o.   MRN: [id number]   Chief Complaint  Patient presents with   Weight Check    HPI  Patient presents today for a weight check and BP check. She is tolerating the [facility] well.  She has been exercising. She has no specific concerns or complaints.   Hypertension This is a chronic problem. The current episode started more than 1 month ago. The problem has been gradually improving since onset. The problem is controlled. Pertinent negatives include no blurred vision, chest pain, headaches, PND or shortness of breath. Past treatments include calcium channel blockers. The current treatment provides moderate improvement.     Past Medical History:  Diagnosis Date   Allergy    seasonal   Anemia    Fibroids    Hypertension    Sleep apnea    "mild",no cpap     Family History  Problem Relation Age of Onset   Hypertension Mother    Hyperthyroidism Mother    Prostate cancer Maternal Grandfather    Colon cancer Neg Hx    Colon polyps Neg Hx    Celiac disease Neg Hx    Crohn's disease Neg Hx    Esophageal cancer Neg Hx    Rectal cancer Neg Hx    Stomach cancer Neg Hx      Current Outpatient Medications:    albuterol (VENTOLIN HFA) 108 (90 Base) MCG/ACT inhaler, Inhale 2 puffs into the lungs every 6 (six) hours as needed for wheezing or shortness of breath., Disp: 18 g, Rfl: 3   Blood Pressure Monitoring (BLOOD PRESSURE DIGITAL SOLN) KIT, , Disp: , Rfl:    cyclobenzaprine (FLEXERIL) 10 MG tablet, Take 1 tablet (10 mg total) by mouth 3 (three) times daily as needed for muscle spasms., Disp: 30 tablet, Rfl: 0   folic acid (FOLVITE) 1 MG tablet, Take 1 tablet (1 mg total) by mouth  daily., Disp: 30 tablet, Rfl: 6   ibuprofen (ADVIL) 800 MG tablet, Take 800 mg by mouth every 6 (six) hours as needed., Disp: , Rfl:    levocetirizine (XYZAL) 5 MG tablet, TAKE 1 TABLET BY MOUTH EVERY DAY IN THE EVENING (Patient taking differently: as needed. TAKE 1 TABLET BY MOUTH EVERY DAY IN THE EVENING), Disp: 90 tablet, Rfl: 2   Multiple Vitamin (MULTIVITAMIN) tablet, Take 1 tablet by mouth daily., Disp: , Rfl:    SODIUM FLUORIDE 5000 SENSITIVE 1.1-5 % GEL, Take by mouth daily at 6 (six) AM., Disp: , Rfl:    amLODipine (NORVASC) 5 MG tablet, TAKE 1 TABLET(5 MG) BY MOUTH AT BEDTIME, Disp: 90 tablet, Rfl: 2   Semaglutide-Weight Management (WEGOVY) 2.4 MG/0.75ML SOAJ, Inject 2.4 mg into the skin once a week., Disp: 3 mL, Rfl: 3   No Known Allergies   Review of Systems  Constitutional: Negative.   Eyes:  Negative for blurred vision.  Respiratory: Negative.  Negative for shortness of breath.   Cardiovascular: Negative.  Negative for chest pain and PND.  Gastrointestinal: Negative.   Neurological: Negative.  Negative for headaches.  Psychiatric/Behavioral: Negative.       Today's Vitals   01/31/22 0826  BP: 120/84  Pulse: 74  Temp: 98.4 F (36.9 C)  Weight: 185 lb (83.9 kg)  Height: 5' 3"  (1.6 m)  PainSc: 0-No pain   Body mass index is 32.77 kg/m.  Wt Readings from Last 3 Encounters:  01/31/22 185 lb (83.9 kg)  11/22/21 189 lb (85.7 kg)  09/27/21 192 lb 12.8 oz (87.5 kg)     Objective:  Physical Exam Vitals and nursing note reviewed.  Constitutional:      Appearance: Normal appearance.  HENT:     Head: Normocephalic and atraumatic.     Nose:     Comments: Masked     Mouth/Throat:     Comments: Masked  Eyes:     Extraocular Movements: Extraocular movements intact.  Cardiovascular:     Rate and Rhythm: Normal rate and regular rhythm.     Heart sounds: Normal heart sounds.  Pulmonary:     Effort: Pulmonary effort is normal.     Breath sounds: Normal breath sounds.   Skin:    General: Skin is warm.  Neurological:     General: No focal deficit present.     Mental Status: She is alert.  Psychiatric:        Mood and Affect: Mood normal.        Behavior: Behavior normal.         Assessment And Plan:     1. Class 1 obesity due to excess calories with serious comorbidity and body mass index (BMI) of 32.0 to 32.9 in adult Comments: She was congratulated on her 4lb weight loss since August 2023. She is encouraged to exercise at least 150 min/wk. F/u in 8 wks.   2. Essential hypertension Comments: Chronic, well controlled. She will c/w amlodipine 35m daily.   3. Immunization due - Flu Vaccine QUAD 6+ mos PF IM (Fluarix Quad PF)   Patient was given opportunity to ask questions. Patient verbalized understanding of the plan and was able to repeat key elements of the plan. All questions were answered to their satisfaction.   I, RMaximino Greenland MD, have reviewed all documentation for this visit. The documentation on 01/31/22 for the exam, diagnosis, procedures, and orders are all accurate and complete.   IF YOU HAVE BEEN REFERRED TO A SPECIALIST, IT MAY TAKE 1-2 WEEKS TO SCHEDULE/PROCESS THE REFERRAL. IF YOU HAVE NOT HEARD FROM US/SPECIALIST IN TWO WEEKS, PLEASE GIVE [place] CALL AT [phone].   THE PATIENT IS ENCOURAGED TO PRACTICE SOCIAL DISTANCING DUE TO THE COVID-19 PANDEMIC.

## 2022-03-19 ENCOUNTER — Other Ambulatory Visit: Payer: Self-pay | Admitting: Internal Medicine

## 2022-03-27 ENCOUNTER — Encounter: Payer: Self-pay | Admitting: Internal Medicine

## 2022-03-30 ENCOUNTER — Telehealth (INDEPENDENT_AMBULATORY_CARE_PROVIDER_SITE_OTHER): Payer: 59 | Admitting: Internal Medicine

## 2022-03-30 ENCOUNTER — Encounter: Payer: Self-pay | Admitting: Internal Medicine

## 2022-03-30 VITALS — BP 138/86 | HR 96 | Ht 63.0 in | Wt 183.0 lb

## 2022-03-30 DIAGNOSIS — I10 Essential (primary) hypertension: Secondary | ICD-10-CM

## 2022-03-30 DIAGNOSIS — E6609 Other obesity due to excess calories: Secondary | ICD-10-CM | POA: Diagnosis not present

## 2022-03-30 DIAGNOSIS — Z6832 Body mass index (BMI) 32.0-32.9, adult: Secondary | ICD-10-CM | POA: Diagnosis not present

## 2022-03-30 DIAGNOSIS — E66811 Other obesity due to excess calories: Secondary | ICD-10-CM

## 2022-03-30 MED ORDER — WEGOVY 2.4 MG/0.75ML ~~LOC~~ SOAJ
2.4000 mg | SUBCUTANEOUS | 3 refills | Status: DC
Start: 2022-03-30 — End: 2022-06-07

## 2022-03-30 NOTE — Patient Instructions (Signed)

## 2022-03-30 NOTE — Progress Notes (Signed)
Virtual Visit via Video   This visit type was conducted due to national recommendations for restrictions regarding the COVID-19 Pandemic (e.g. social distancing) in an effort to limit this patient's exposure and mitigate transmission in our community.  Due to her co-morbid illnesses, this patient is at least at moderate risk for complications without adequate follow up.  This format is felt to be most appropriate for this patient at this time.  All issues noted in this document were discussed and addressed.  A limited physical exam was performed with this format.    This visit type was conducted due to national recommendations for restrictions regarding the COVID-19 Pandemic (e.g. social distancing) in an effort to limit this patient's exposure and mitigate transmission in our community.  Patients identity confirmed using two different identifiers.  This format is felt to be most appropriate for this patient at this time.  All issues noted in this document were discussed and addressed.  No physical exam was performed (except for noted visual exam findings with Video Visits).    Date:  04/06/2022   ID:  [name], DOB [birth year], MRN [id number]  Patient Location:  Home  Provider location:   Office    Chief Complaint:  "I have weight f/u"  History of Present Illness:    [name] is a 46 y.o. female who presents via video conferencing for a telehealth visit today.    The patient does not have symptoms concerning for COVID-19 infection (fever, chills, cough, or new shortness of breath).   She presents today for virtual visit. She presents today for f/u obesity. She has been using weekly Wegovy without any issues. She is upset b/c visit started later than scheduled and that she was asked to submit her vitals. She denies having any other concerns at this time.      Past Medical History:  Diagnosis Date   Allergy    seasonal   Anemia    Fibroids    Hypertension     Sleep apnea    "mild",no cpap   Past Surgical History:  Procedure Laterality Date   BILATERAL SALPINGECTOMY  2013   along with myomectomy   IR GENERIC HISTORICAL  11/18/2015   IR RADIOLOGIST EVAL & MGMT 11/18/2015 Sandi Mariscal, MD [facility] INTERV RAD   ROBOT ASSISTED MYOMECTOMY  2013   for uterine fibroids; along with salpingectomy     Current Meds  Medication Sig   albuterol (VENTOLIN HFA) 108 (90 Base) MCG/ACT inhaler Inhale 2 puffs into the lungs every 6 (six) hours as needed for wheezing or shortness of breath.   amLODipine (NORVASC) 5 MG tablet TAKE 1 TABLET(5 MG) BY MOUTH AT BEDTIME   Blood Pressure Monitoring (BLOOD PRESSURE DIGITAL SOLN) KIT    cyclobenzaprine (FLEXERIL) 10 MG tablet Take 1 tablet (10 mg total) by mouth 3 (three) times daily as needed for muscle spasms.   folic acid (FOLVITE) 1 MG tablet Take 1 tablet (1 mg total) by mouth daily.   ibuprofen (ADVIL) 800 MG tablet Take 800 mg by mouth every 6 (six) hours as needed.   levocetirizine (XYZAL) 5 MG tablet Take 1 tablet (5 mg total) by mouth as needed. TAKE 1 TABLET BY MOUTH EVERY DAY IN THE EVENING   Multiple Vitamin (MULTIVITAMIN) tablet Take 1 tablet by mouth daily.   SODIUM FLUORIDE 5000 SENSITIVE 1.1-5 % GEL Take by mouth daily at 6 (six) AM.   [DISCONTINUED] Semaglutide-Weight Management (WEGOVY) 2.4 MG/0.75ML SOAJ Inject 2.4 mg into the  skin once a week.     Allergies:   Patient has no known allergies.   Social History   Tobacco Use   Smoking status: Never    Passive exposure: Never   Smokeless tobacco: Never  Vaping Use   Vaping Use: Never used  Substance Use Topics   Alcohol use: Yes    Alcohol/week: 2.0 standard drinks of alcohol    Types: 2 Glasses of wine per week    Comment: liquor 2 x a week   Drug use: No     Family Hx: The patient's family history includes Hypertension in her mother; Hyperthyroidism in her mother; Prostate cancer in her maternal grandfather. There is no history of Colon  cancer, Colon polyps, Celiac disease, Crohn's disease, Esophageal cancer, Rectal cancer, or Stomach cancer.  ROS:   Please see the history of present illness.    Review of Systems  Constitutional: Negative.   Respiratory: Negative.    Cardiovascular: Negative.   Gastrointestinal: Negative.   Neurological: Negative.   Psychiatric/Behavioral: Negative.      All other systems reviewed and are negative.   Labs/Other Tests and Data Reviewed:    Recent Labs: 05/16/2021: ALT 16; Hemoglobin 14.4; Platelets 292 11/22/2021: BUN 9; Creatinine, Ser 0.89; Potassium 4.7; Sodium 142   Recent Lipid Panel Lab Results  Component Value Date/Time   CHOL 179 05/16/2021 03:12 PM   TRIG 123 05/16/2021 03:12 PM   HDL 44 05/16/2021 03:12 PM   CHOLHDL 4.1 05/16/2021 03:12 PM   LDLCALC 113 (H) 05/16/2021 03:12 PM    Wt Readings from Last 3 Encounters:  03/30/22 183 lb (83 kg)  01/31/22 185 lb (83.9 kg)  11/22/21 189 lb (85.7 kg)      Exam:    Vital Signs:  BP 138/86   Pulse 96   Ht _0  (1.6 m)   Wt 183 lb (83 kg)   LMP 03/02/2022   BMI 32.42 kg/m      Physical Exam Vitals and nursing note reviewed.  Constitutional:      Appearance: Normal appearance.  HENT:     Head: Normocephalic and atraumatic.  Eyes:     Extraocular Movements: Extraocular movements intact.  Pulmonary:     Effort: Pulmonary effort is normal.  Musculoskeletal:     Cervical back: Normal range of motion.  Neurological:     Mental Status: She is alert and oriented to person, place, and time.  Psychiatric:        Mood and Affect: Affect normal.     ASSESSMENT & PLAN:     1. Class 1 obesity due to excess calories with serious comorbidity and body mass index (BMI) of 32.0 to 32.9 in adult Comments: She will c/w [facility]. She will f/u in 8-10 weeks. Encouraged to aim for at least 150 minutes of exercise/week.  2. Essential hypertension Comments: Fair control, optimal reading is less than 120/80.  No med changes  today.    COVID-19 Education: The signs and symptoms of COVID-19 were discussed with the patient and how to seek care for testing (follow up with PCP or arrange E-visit).  The importance of social distancing was discussed today.  Patient Risk:   After full review of this patients clinical status, I feel that they are at least moderate risk at this time.  Time:   Today, I have spent 6 minutes/ seconds with the patient with telehealth technology discussing above diagnoses.     Medication Adjustments/Labs and Tests Ordered: Current medicines  are reviewed at length with the patient today.  Concerns regarding medicines are outlined above.   Tests Ordered: No orders of the defined types were placed in this encounter.   Medication Changes: Meds ordered this encounter  Medications   Semaglutide-Weight Management (WEGOVY) 2.4 MG/0.75ML SOAJ    Sig: Inject 2.4 mg into the skin once a week.    Dispense:  3 mL    Refill:  3    Disposition:  Follow up in 10 week(s)  Signed, [name], MD

## 2022-05-07 ENCOUNTER — Other Ambulatory Visit: Payer: Self-pay | Admitting: Internal Medicine

## 2022-06-07 ENCOUNTER — Ambulatory Visit (INDEPENDENT_AMBULATORY_CARE_PROVIDER_SITE_OTHER): Payer: 59 | Admitting: Internal Medicine

## 2022-06-07 ENCOUNTER — Encounter: Payer: Self-pay | Admitting: Internal Medicine

## 2022-06-07 VITALS — BP 130/80 | HR 100 | Temp 98.8°F | Ht 63.0 in | Wt 182.8 lb

## 2022-06-07 DIAGNOSIS — R7309 Other abnormal glucose: Secondary | ICD-10-CM

## 2022-06-07 DIAGNOSIS — Z23 Encounter for immunization: Secondary | ICD-10-CM | POA: Diagnosis not present

## 2022-06-07 DIAGNOSIS — Z0000 Encounter for general adult medical examination without abnormal findings: Secondary | ICD-10-CM

## 2022-06-07 DIAGNOSIS — E6609 Other obesity due to excess calories: Secondary | ICD-10-CM | POA: Diagnosis not present

## 2022-06-07 DIAGNOSIS — R000 Tachycardia, unspecified: Secondary | ICD-10-CM | POA: Diagnosis not present

## 2022-06-07 DIAGNOSIS — I10 Essential (primary) hypertension: Secondary | ICD-10-CM | POA: Diagnosis not present

## 2022-06-07 DIAGNOSIS — H6122 Impacted cerumen, left ear: Secondary | ICD-10-CM | POA: Diagnosis not present

## 2022-06-07 DIAGNOSIS — Z6832 Body mass index (BMI) 32.0-32.9, adult: Secondary | ICD-10-CM

## 2022-06-07 LAB — POCT URINALYSIS DIPSTICK
Bilirubin, UA: NEGATIVE
Glucose, UA: NEGATIVE
Ketones, UA: NEGATIVE
Leukocytes, UA: NEGATIVE
Nitrite, UA: NEGATIVE
Protein, UA: NEGATIVE
Spec Grav, UA: >=1.03 — AB (ref 1.010–1.025)
Urobilinogen, UA: 0.2 E.U./dL
pH, UA: 5.5 (ref 5.0–8.0)

## 2022-06-07 MED ORDER — AMLODIPINE BESYLATE 5 MG PO TABS: ORAL_TABLET | ORAL | 2 refills | Status: DC

## 2022-06-07 MED ORDER — WEGOVY 2.4 MG/0.75ML ~~LOC~~ SOAJ: 2.4000 mg | SUBCUTANEOUS | 3 refills | Status: DC

## 2022-06-07 NOTE — Progress Notes (Signed)
[name],acting as a Neurosurgeon for Heather Aliment, MD.,have documented all relevant documentation on the behalf of Heather Aliment, MD,as directed by  Heather Aliment, MD while in the presence of Heather Aliment, MD.   Subjective:     Patient ID: [name] , female    DOB: January 20, 1976 , 47 y.o.   MRN: [id number]   Chief Complaint  Patient presents with   Annual Exam   Hypertension    HPI  She is here today for a full physical examination. She is followed by Dr. Cherly Kline for her GYN exams.  She is scheduled for pelvic exam on 06/19/22. She has no specific concerns or complaints at this time. She reports compliance with meds. She denies headaches, chest pain and shortness of breath.   BP Readings from Last 3 Encounters: 06/07/22 : (!) 140/76 03/30/22 : 138/86 01/31/22 : 120/84    Hypertension This is a chronic problem. The current episode started more than 1 month ago. The problem has been gradually improving since onset. The problem is controlled. Pertinent negatives include no blurred vision, chest pain, headaches, PND or shortness of breath. Past treatments include calcium channel blockers. The current treatment provides moderate improvement.     Past Medical History:  Diagnosis Date   Allergy    seasonal   Anemia    Fibroids    Hypertension    Sleep apnea    "mild",no cpap     Family History  Problem Relation Age of Onset   Hypertension Mother    Hyperthyroidism Mother    Prostate cancer Maternal Grandfather    Colon cancer Neg Hx    Colon polyps Neg Hx    Celiac disease Neg Hx    Crohn's disease Neg Hx    Esophageal cancer Neg Hx    Rectal cancer Neg Hx    Stomach cancer Neg Hx      Current Outpatient Medications:    albuterol (VENTOLIN HFA) 108 (90 Base) MCG/ACT inhaler, TAKE 2 PUFFS BY MOUTH EVERY 6 HOURS AS NEEDED FOR WHEEZE OR SHORTNESS OF BREATH, Disp: 6.7 each, Rfl: 3   Blood Pressure Monitoring (BLOOD PRESSURE DIGITAL SOLN) KIT, , Disp: ,  Rfl:    cyclobenzaprine (FLEXERIL) 10 MG tablet, Take 1 tablet (10 mg total) by mouth 3 (three) times daily as needed for muscle spasms., Disp: 30 tablet, Rfl: 0   folic acid (FOLVITE) 1 MG tablet, Take 1 tablet (1 mg total) by mouth daily., Disp: 30 tablet, Rfl: 6   ibuprofen (ADVIL) 800 MG tablet, Take 800 mg by mouth every 6 (six) hours as needed., Disp: , Rfl:    levocetirizine (XYZAL) 5 MG tablet, Take 1 tablet (5 mg total) by mouth as needed. TAKE 1 TABLET BY MOUTH EVERY DAY IN THE EVENING, Disp: 90 tablet, Rfl: 1   Multiple Vitamin (MULTIVITAMIN) tablet, Take 1 tablet by mouth daily., Disp: , Rfl:    SODIUM FLUORIDE 5000 SENSITIVE 1.1-5 % GEL, Take by mouth daily at 6 (six) AM., Disp: , Rfl:    amLODipine (NORVASC) 5 MG tablet, TAKE 1 TABLET(5 MG) BY MOUTH AT BEDTIME, Disp: 90 tablet, Rfl: 2   Semaglutide-Weight Management (WEGOVY) 2.4 MG/0.75ML SOAJ, Inject 2.4 mg into the skin once a week., Disp: 3 mL, Rfl: 3   No Known Allergies    The patient states she uses none for birth control. Last LMP was Patient's last menstrual period was 06/01/2022.. Negative for Dysmenorrhea. Negative for: breast discharge, breast lump(s),  breast pain and breast self exam. Associated symptoms include abnormal vaginal bleeding. Pertinent negatives include abnormal bleeding (hematology), anxiety, decreased libido, depression, difficulty falling sleep, dyspareunia, history of infertility, nocturia, sexual dysfunction, sleep disturbances, urinary incontinence, urinary urgency, vaginal discharge and vaginal itching. Diet regular.The patient states her exercise level is    . The patient's tobacco use is:  Social History   Tobacco Use  Smoking Status Never   Passive exposure: Never  Smokeless Tobacco Never  . She has been exposed to passive smoke. The patient's alcohol use is:  Social History   Substance and Sexual Activity  Alcohol Use Yes   Alcohol/week: 2.0 standard drinks of alcohol   Types: 2 Glasses of  wine per week   Comment: liquor 2 x a week    Review of Systems  Constitutional: Negative.   HENT: Negative.    Eyes: Negative.  Negative for blurred vision.  Respiratory: Negative.  Negative for shortness of breath.   Cardiovascular: Negative.  Negative for chest pain and PND.  Gastrointestinal: Negative.   Endocrine: Negative.   Genitourinary: Negative.   Musculoskeletal: Negative.   Skin: Negative.   Allergic/Immunologic: Negative.   Neurological: Negative.  Negative for headaches.  Hematological: Negative.   Psychiatric/Behavioral: Negative.       Today's Vitals   06/07/22 0906 06/07/22 0917  BP: (!) 140/76 130/80  Pulse: 100   Temp: 98.8 F (37.1 C)   TempSrc: Oral   Weight: 182 lb 12.8 oz (82.9 kg)   Height: 5\' 3"  (1.6 m)   PainSc: 0-No pain    Body mass index is 32.38 kg/m.  Wt Readings from Last 3 Encounters:  06/07/22 182 lb 12.8 oz (82.9 kg)  03/30/22 183 lb (83 kg)  01/31/22 185 lb (83.9 kg)    Objective:  Physical Exam Vitals and nursing note reviewed.  Constitutional:      Appearance: Normal appearance.  HENT:     Head: Normocephalic and atraumatic.     Right Ear: Tympanic membrane, ear canal and external ear normal. There is no impacted cerumen.     Left Ear: Ear canal and external ear normal. There is impacted cerumen.     Nose:     Comments: Masked     Mouth/Throat:     Comments: Masked  Eyes:     Extraocular Movements: Extraocular movements intact.  Cardiovascular:     Rate and Rhythm: Normal rate and regular rhythm.     Heart sounds: Normal heart sounds.  Pulmonary:     Effort: Pulmonary effort is normal.     Breath sounds: Normal breath sounds.  Chest:  Breasts:    Tanner Score is 5.     Right: Normal.     Left: Normal.  Abdominal:     General: Bowel sounds are normal.     Palpations: Abdomen is soft.  Genitourinary:    Comments: Deferred  Musculoskeletal:        General: Normal range of motion.     Cervical back: Normal  range of motion.  Skin:    General: Skin is warm.  Neurological:     General: No focal deficit present.     Mental Status: She is alert.  Psychiatric:        Mood and Affect: Mood normal.        Behavior: Behavior normal.     Assessment And Plan:     1. Encounter for general adult medical examination w/o abnormal findings Comments: A full exam was  performed. Importance of monthly self breast exams was discussed with the patient.  She is UTD with CRC/ breast/cervical CA screening.  PATIENT IS ADVISED TO GET 30-45 MINUTES REGULAR EXERCISE NO LESS THAN FOUR TO FIVE DAYS PER WEEK - BOTH WEIGHTBEARING EXERCISES AND AEROBIC ARE RECOMMENDED.  PATIENT IS ADVISED TO FOLLOW A HEALTHY DIET WITH AT LEAST SIX FRUITS/VEGGIES PER DAY, DECREASE INTAKE OF RED MEAT, AND TO INCREASE FISH INTAKE TO TWO DAYS PER WEEK.  MEATS/FISH SHOULD NOT BE FRIED, BAKED OR BROILED IS PREFERABLE.  IT IS ALSO IMPORTANT TO CUT BACK ON YOUR SUGAR INTAKE. PLEASE AVOID ANYTHING WITH ADDED SUGAR, CORN SYRUP OR OTHER SWEETENERS. IF YOU MUST USE A SWEETENER, YOU CAN TRY STEVIA. IT IS ALSO IMPORTANT TO AVOID ARTIFICIALLY SWEETENERS AND DIET BEVERAGES. LASTLY, I SUGGEST WEARING SPF 50 SUNSCREEN ON EXPOSED PARTS AND ESPECIALLY WHEN IN THE DIRECT SUNLIGHT FOR AN EXTENDED PERIOD OF TIME.  PLEASE AVOID FAST FOOD RESTAURANTS AND INCREASE YOUR WATER INTAKE. - CMP14+EGFR - CBC - Lipid panel - TSH - Vitamin D (25 hydroxy)  2. Essential hypertension Comments: Chronic, fair control. EKG performed, ST w/o acute changes. No med changes today, Goal BP<130/80. She will f/u in 3-4 months, encouraged to limit her sodium intake.  - POCT Urinalysis Dipstick (81002) - Microalbumin / creatinine urine ratio - EKG 12-Lead - amLODipine (NORVASC) 5 MG tablet; TAKE 1 TABLET(5 MG) BY MOUTH AT BEDTIME  Dispense: 90 tablet; Refill: 2  3. Left ear impacted cerumen Comments: After obtaining verbal consent, left ear was flushed by irrigation w/o complication. No  TM abnormalities were noted. - Ear Lavage  4. Sinus tachycardia by electrocardiogram Comments: HR 103, admits to no water this morning. Also drank some tea.  She is encouraged to stay well hydrated.  5. Class 1 obesity due to excess calories with serious comorbidity and body mass index (BMI) of 32.0 to 32.9 in adult Comments: Her goal weight is 180lbs. She will c/w [facility] 2.4mg  weekly, plans to decrease to 1.7mg  at next refill. She will f/u in 10 wks . - Semaglutide-Weight Management (WEGOVY) 2.4 MG/0.75ML SOAJ; Inject 2.4 mg into the skin once a week.  Dispense: 3 mL; Refill: 3  6. COVID-19 vaccine administered - Pfizer Fall 2023 Covid-19 Vaccine 63yrs and older  Patient was given opportunity to ask questions. Patient verbalized understanding of the plan and was able to repeat key elements of the plan. All questions were answered to their satisfaction.   I, Heather Aliment, MD, have reviewed all documentation for this visit. The documentation on 06/07/22 for the exam, diagnosis, procedures, and orders are all accurate and complete.   THE PATIENT IS ENCOURAGED TO PRACTICE SOCIAL DISTANCING DUE TO THE COVID-19 PANDEMIC.

## 2022-06-07 NOTE — Patient Instructions (Signed)

## 2022-06-08 LAB — CMP14+EGFR
ALT: 12 IU/L (ref 0–32)
AST: 13 IU/L (ref 0–40)
Albumin/Globulin Ratio: 1.4 (ref 1.2–2.2)
Albumin: 4.6 g/dL (ref 3.9–4.9)
Alkaline Phosphatase: 84 IU/L (ref 44–121)
BUN/Creatinine Ratio: 12 (ref 9–23)
BUN: 12 mg/dL (ref 6–24)
Bilirubin Total: 0.3 mg/dL (ref 0.0–1.2)
CO2: 23 mmol/L (ref 20–29)
Calcium: 8.5 mg/dL — ABNORMAL LOW (ref 8.7–10.2)
Chloride: 102 mmol/L (ref 96–106)
Creatinine, Ser: 1 mg/dL (ref 0.57–1.00)
Globulin, Total: 3.3 g/dL (ref 1.5–4.5)
Glucose: 68 mg/dL — ABNORMAL LOW (ref 70–99)
Potassium: 4.4 mmol/L (ref 3.5–5.2)
Sodium: 140 mmol/L (ref 134–144)
Total Protein: 7.9 g/dL (ref 6.0–8.5)
eGFR: 70 mL/min/{1.73_m2} (ref >59)

## 2022-06-08 LAB — CBC
Hematocrit: 37.7 % (ref 34.0–46.6)
Hemoglobin: 11.9 g/dL (ref 11.1–15.9)
MCH: 24.9 pg — ABNORMAL LOW (ref 26.6–33.0)
MCHC: 31.6 g/dL (ref 31.5–35.7)
MCV: 79 fL (ref 79–97)
Platelets: 391 10*3/uL (ref 150–450)
RBC: 4.78 x10E6/uL (ref 3.77–5.28)
RDW: 13.8 % (ref 11.7–15.4)
WBC: 7 10*3/uL (ref 3.4–10.8)

## 2022-06-08 LAB — MICROALBUMIN / CREATININE URINE RATIO
Creatinine, Urine: 191.4 mg/dL
Microalb/Creat Ratio: 5 mg/g creat (ref 0–29)
Microalbumin, Urine: 10.4 ug/mL

## 2022-06-08 LAB — LIPID PANEL
Chol/HDL Ratio: 2.3 ratio (ref 0.0–4.4)
Cholesterol, Total: 176 mg/dL (ref 100–199)
HDL: 78 mg/dL (ref >39)
LDL Chol Calc (NIH): 82 mg/dL (ref 0–99)
Triglycerides: 88 mg/dL (ref 0–149)
VLDL Cholesterol Cal: 16 mg/dL (ref 5–40)

## 2022-06-08 LAB — TSH: TSH: 2.2 u[IU]/mL (ref 0.450–4.500)

## 2022-06-08 LAB — VITAMIN D 25 HYDROXY (VIT D DEFICIENCY, FRACTURES): Vit D, 25-Hydroxy: 34.5 ng/mL (ref 30.0–100.0)

## 2022-06-14 ENCOUNTER — Other Ambulatory Visit (HOSPITAL_COMMUNITY): Payer: Self-pay

## 2022-06-19 LAB — HM PAP SMEAR: HM Pap smear: NORMAL

## 2022-08-30 ENCOUNTER — Encounter: Payer: Self-pay | Admitting: Internal Medicine

## 2022-08-30 ENCOUNTER — Ambulatory Visit (INDEPENDENT_AMBULATORY_CARE_PROVIDER_SITE_OTHER): Payer: 59 | Admitting: Internal Medicine

## 2022-08-30 VITALS — BP 126/84 | HR 98 | Temp 98.5°F | Ht 63.0 in | Wt 182.8 lb

## 2022-08-30 DIAGNOSIS — E6609 Other obesity due to excess calories: Secondary | ICD-10-CM

## 2022-08-30 DIAGNOSIS — Z6832 Body mass index (BMI) 32.0-32.9, adult: Secondary | ICD-10-CM

## 2022-08-30 DIAGNOSIS — I10 Essential (primary) hypertension: Secondary | ICD-10-CM | POA: Diagnosis not present

## 2022-08-30 MED ORDER — WEGOVY 2.4 MG/0.75ML ~~LOC~~ SOAJ: 2.4000 mg | SUBCUTANEOUS | 0 refills | Status: DC

## 2022-08-30 NOTE — Patient Instructions (Signed)

## 2022-08-30 NOTE — Progress Notes (Signed)
I,[name],acting as a scribe for [name], MD.,have documented all relevant documentation on the behalf of [name], MD,as directed by  [name], MD while in the presence of [name], MD.    Subjective:     Patient ID: [name] , female    DOB: 21-Jun-1975 , 47 y.o.   MRN: [id number]   Chief Complaint  Patient presents with   Weight Check    HPI  She presents today for weight & bp check. She has been using weekly Wegovy without any issues.  She reports compliance with bp medication. Denies headache, chest pain, SOB. She reports no specific questions or concerns.   Letter sent to GYN: Dr Cherly Hensen for pap result.      Past Medical History:  Diagnosis Date   Allergy    seasonal   Anemia    Fibroids    Hypertension    Sleep apnea    "mild",no cpap     Family History  Problem Relation Age of Onset   Hypertension Mother    Hyperthyroidism Mother    Prostate cancer Maternal Grandfather    Colon cancer Neg Hx    Colon polyps Neg Hx    Celiac disease Neg Hx    Crohn's disease Neg Hx    Esophageal cancer Neg Hx    Rectal cancer Neg Hx    Stomach cancer Neg Hx      Current Outpatient Medications:    albuterol (VENTOLIN HFA) 108 (90 Base) MCG/ACT inhaler, TAKE 2 PUFFS BY MOUTH EVERY 6 HOURS AS NEEDED FOR WHEEZE OR SHORTNESS OF BREATH, Disp: 6.7 each, Rfl: 3   amLODipine (NORVASC) 5 MG tablet, TAKE 1 TABLET(5 MG) BY MOUTH AT BEDTIME, Disp: 90 tablet, Rfl: 2   Blood Pressure Monitoring (BLOOD PRESSURE DIGITAL SOLN) KIT, , Disp: , Rfl:    cyclobenzaprine (FLEXERIL) 10 MG tablet, Take 1 tablet (10 mg total) by mouth 3 (three) times daily as needed for muscle spasms., Disp: 30 tablet, Rfl: 0   folic acid (FOLVITE) 1 MG tablet, Take 1 tablet (1 mg total) by mouth daily., Disp: 30 tablet, Rfl: 6   ibuprofen (ADVIL) 800 MG tablet, Take 800 mg by mouth every 6 (six) hours as needed., Disp: , Rfl:    levocetirizine (XYZAL) 5 MG tablet,  Take 1 tablet (5 mg total) by mouth as needed. TAKE 1 TABLET BY MOUTH EVERY DAY IN THE EVENING, Disp: 90 tablet, Rfl: 1   Multiple Vitamin (MULTIVITAMIN) tablet, Take 1 tablet by mouth daily., Disp: , Rfl:    SODIUM FLUORIDE 5000 SENSITIVE 1.1-5 % GEL, Take by mouth daily at 6 (six) AM., Disp: , Rfl:    Semaglutide-Weight Management (WEGOVY) 2.4 MG/0.75ML SOAJ, Inject 2.4 mg into the skin once a week., Disp: 9 mL, Rfl: 0   No Known Allergies   Review of Systems  Constitutional: Negative.   Respiratory: Negative.    Cardiovascular: Negative.   Gastrointestinal: Negative.   Skin: Negative.   Neurological: Negative.   Psychiatric/Behavioral: Negative.       Today's Vitals   08/30/22 1419  BP: 126/84  Pulse: 98  Temp: 98.5 F (36.9 C)  Weight: 182 lb 12.8 oz (82.9 kg)  Height: 5\' 3"  (1.6 m)  PainSc: 0-No pain   Wt Readings from Last 3 Encounters:  08/30/22 182 lb 12.8 oz (82.9 kg)  06/07/22 182 lb 12.8 oz (82.9 kg)  03/30/22 183 lb (83 kg)    Body mass  index is 32.38 kg/m.  The 10-year ASCVD risk score (Arnett DK, et al., 2019) is: 1.3%   Values used to calculate the score:     Age: 85 years     Sex: Female     Is Non-Hispanic African American: Yes     Diabetic: No     Tobacco smoker: No     Systolic Blood Pressure: 126 mmHg     Is BP treated: Yes     HDL Cholesterol: 78 mg/dL     Total Cholesterol: 176 mg/dL ++ Objective:  Physical Exam Vitals and nursing note reviewed.  Constitutional:      Appearance: Normal appearance.  HENT:     Head: Normocephalic and atraumatic.  Eyes:     Extraocular Movements: Extraocular movements intact.  Cardiovascular:     Rate and Rhythm: Normal rate and regular rhythm.     Heart sounds: Normal heart sounds.  Pulmonary:     Effort: Pulmonary effort is normal.     Breath sounds: Normal breath sounds.  Musculoskeletal:     Cervical back: Normal range of motion.  Skin:    General: Skin is warm.  Neurological:     General: No  focal deficit present.     Mental Status: She is alert.  Psychiatric:        Mood and Affect: Mood normal.        Behavior: Behavior normal.      Assessment And Plan:     1. Class 1 obesity due to excess calories with serious comorbidity and body mass index (BMI) of 32.0 to 32.9 in adult Comments: Her goal weight is 170lbs. She will c/w [facility] 2.4mg  weekly. She will f/u in August 2024. Encouraged to increase protein intake and exercise. - Semaglutide-Weight Management (WEGOVY) 2.4 MG/0.75ML SOAJ; Inject 2.4 mg into the skin once a week.  Dispense: 9 mL; Refill: 0  2. Essential hypertension Comments: Chronic, fair control. Goal <120/80.  She will c/w amlodipine 5mg  daily.  She will f/u in 3  months, I will check renal function at that time.   Return if symptoms worsen or fail to improve.  Patient was given opportunity to ask questions. Patient verbalized understanding of the plan and was able to repeat key elements of the plan. All questions were answered to their satisfaction.   I, [name], MD, have reviewed all documentation for this visit. The documentation on 08/30/22 for the exam, diagnosis, procedures, and orders are all accurate and complete.   IF YOU HAVE BEEN REFERRED TO A SPECIALIST, IT MAY TAKE 1-2 WEEKS TO SCHEDULE/PROCESS THE REFERRAL. IF YOU HAVE NOT HEARD FROM US/SPECIALIST IN TWO WEEKS, PLEASE GIVE [place] A CALL AT [phone].   THE PATIENT IS ENCOURAGED TO PRACTICE SOCIAL DISTANCING DUE TO THE COVID-19 PANDEMIC.

## 2022-09-05 ENCOUNTER — Encounter: Payer: Self-pay | Admitting: Internal Medicine

## 2022-12-05 NOTE — Patient Instructions (Signed)
Hypertension, Adult Hypertension is another name for high blood pressure. High blood pressure forces your heart to work harder to pump blood. This can cause problems over time. There are two numbers in a blood pressure reading. There is a top number (systolic) over a bottom number (diastolic). It is best to have a blood pressure that is below 120/80. What are the causes? The cause of this condition is not known. Some other conditions can lead to high blood pressure. What increases the risk? Some lifestyle factors can make you more likely to develop high blood pressure: Smoking. Not getting enough exercise or physical activity. Being overweight. Having too much fat, sugar, calories, or salt (sodium) in your diet. Drinking too much alcohol. Other risk factors include: Having any of these conditions: Heart disease. Diabetes. High cholesterol. Kidney disease. Obstructive sleep apnea. Having a family history of high blood pressure and high cholesterol. Age. The risk increases with age. Stress. What are the signs or symptoms? High blood pressure may not cause symptoms. Very high blood pressure (hypertensive crisis) may cause: Headache. Fast or uneven heartbeats (palpitations). Shortness of breath. Nosebleed. Vomiting or feeling like you may vomit (nauseous). Changes in how you see. Very bad chest pain. Feeling dizzy. Seizures. How is this treated? This condition is treated by making healthy lifestyle changes, such as: Eating healthy foods. Exercising more. Drinking less alcohol. Your doctor may prescribe medicine if lifestyle changes do not help enough and if: Your top number is above 130. Your bottom number is above 80. Your personal target blood pressure may vary. Follow these instructions at home: Eating and drinking  If told, follow the DASH eating plan. To follow this plan: Fill one half of your plate at each meal with fruits and vegetables. Fill one fourth of your plate  at each meal with whole grains. Whole grains include whole-wheat pasta, brown rice, and whole-grain bread. Eat or drink low-fat dairy products, such as skim milk or low-fat yogurt. Fill one fourth of your plate at each meal with low-fat (lean) proteins. Low-fat proteins include fish, chicken without skin, eggs, beans, and tofu. Avoid fatty meat, cured and processed meat, or chicken with skin. Avoid pre-made or processed food. Limit the amount of salt in your diet to less than 1,500 mg each day. Do not drink alcohol if: Your doctor tells you not to drink. You are pregnant, may be pregnant, or are planning to become pregnant. If you drink alcohol: Limit how much you have to: 0-1 drink a day for women. 0-2 drinks a day for men. Know how much alcohol is in your drink. In the [place] one drink equals one 12 oz bottle of beer (355 mL), one 5 oz glass of wine (148 mL), or one 1 oz glass of hard liquor (44 mL). Lifestyle  Work with your doctor to stay at a healthy weight or to lose weight. Ask your doctor what the best weight is for you. Get at least 30 minutes of exercise that causes your heart to beat faster (aerobic exercise) most days of the week. This may include walking, swimming, or biking. Get at least 30 minutes of exercise that strengthens your muscles (resistance exercise) at least 3 days a week. This may include lifting weights or doing Pilates. Do not smoke or use any products that contain nicotine or tobacco. If you need help quitting, ask your doctor. Check your blood pressure at home as told by your doctor. Keep all follow-up visits. Medicines Take over-the-counter and prescription medicines   only as told by your doctor. Follow directions carefully. Do not skip doses of blood pressure medicine. The medicine does not work as well if you skip doses. Skipping doses also puts you at risk for problems. Ask your doctor about side effects or reactions to medicines that you should watch  for. Contact a doctor if: You think you are having a reaction to the medicine you are taking. You have headaches that keep coming back. You feel dizzy. You have swelling in your ankles. You have trouble with your vision. Get help right away if: You get a very bad headache. You start to feel mixed up (confused). You feel weak or numb. You feel faint. You have very bad pain in your: Chest. Belly (abdomen). You vomit more than once. You have trouble breathing. These symptoms may be an emergency. Get help right away. Call 911. Do not wait to see if the symptoms will go away. Do not drive yourself to the hospital. Summary Hypertension is another name for high blood pressure. High blood pressure forces your heart to work harder to pump blood. For most people, a normal blood pressure is less than 120/80. Making healthy choices can help lower blood pressure. If your blood pressure does not get lower with healthy choices, you may need to take medicine. This information is not intended to replace advice given to you by your health care provider. Make sure you discuss any questions you have with your health care provider. Document Revised: 01/13/2021 Document Reviewed: 01/13/2021 Elsevier Patient Education  2024 [employer]  

## 2022-12-06 ENCOUNTER — Ambulatory Visit (INDEPENDENT_AMBULATORY_CARE_PROVIDER_SITE_OTHER): Payer: 59 | Admitting: Internal Medicine

## 2022-12-06 ENCOUNTER — Encounter: Payer: Self-pay | Admitting: Internal Medicine

## 2022-12-06 VITALS — BP 120/82 | HR 83 | Temp 97.9°F | Ht 63.0 in | Wt 182.8 lb

## 2022-12-06 DIAGNOSIS — I10 Essential (primary) hypertension: Secondary | ICD-10-CM | POA: Diagnosis not present

## 2022-12-06 DIAGNOSIS — J301 Allergic rhinitis due to pollen: Secondary | ICD-10-CM | POA: Diagnosis not present

## 2022-12-06 DIAGNOSIS — E6609 Other obesity due to excess calories: Secondary | ICD-10-CM | POA: Diagnosis not present

## 2022-12-06 DIAGNOSIS — K5140 Inflammatory polyps of colon without complications: Secondary | ICD-10-CM

## 2022-12-06 DIAGNOSIS — Z6832 Body mass index (BMI) 32.0-32.9, adult: Secondary | ICD-10-CM

## 2022-12-06 DIAGNOSIS — E66811 Obesity, class 1: Secondary | ICD-10-CM | POA: Insufficient documentation

## 2022-12-06 NOTE — Progress Notes (Signed)
I,[name], CMA,acting as a Neurosurgeon for Heather Aliment, MD.,have documented all relevant documentation on the behalf of Heather Aliment, MD,as directed by  Heather Aliment, MD while in the presence of Heather Aliment, MD.  Subjective:  Patient ID: [name] , female    DOB: 1975/04/24 , 47 y.o.   MRN: [id number]  Chief Complaint  Patient presents with   Hypertension    HPI  She presents today for weight & bp check. She has been using weekly Wegovy without any issues.  She reports compliance with bp medication. Denies headache, chest pain, and SOB. She denies having any  specific questions or concerns.     Hypertension This is a chronic problem. The current episode started more than 1 month ago. The problem has been gradually improving since onset. The problem is controlled. Pertinent negatives include no blurred vision, chest pain, headaches, PND or shortness of breath. Past treatments include calcium channel blockers. The current treatment provides moderate improvement.     Past Medical History:  Diagnosis Date   Allergy    seasonal   Anemia    Fibroids    Hypertension    Sleep apnea    "mild",no cpap     Family History  Problem Relation Age of Onset   Hypertension Mother    Hyperthyroidism Mother    Prostate cancer Maternal Grandfather    Colon cancer Neg Hx    Colon polyps Neg Hx    Celiac disease Neg Hx    Crohn's disease Neg Hx    Esophageal cancer Neg Hx    Rectal cancer Neg Hx    Stomach cancer Neg Hx      Current Outpatient Medications:    albuterol (VENTOLIN HFA) 108 (90 Base) MCG/ACT inhaler, TAKE 2 PUFFS BY MOUTH EVERY 6 HOURS AS NEEDED FOR WHEEZE OR SHORTNESS OF BREATH, Disp: 6.7 each, Rfl: 3   amLODipine (NORVASC) 5 MG tablet, TAKE 1 TABLET(5 MG) BY MOUTH AT BEDTIME, Disp: 90 tablet, Rfl: 2   Blood Pressure Monitoring (BLOOD PRESSURE DIGITAL SOLN) KIT, , Disp: , Rfl:    ibuprofen (ADVIL) 800 MG tablet, Take 800 mg by mouth every 6 (six)  hours as needed., Disp: , Rfl:    levocetirizine (XYZAL) 5 MG tablet, Take 1 tablet (5 mg total) by mouth as needed. TAKE 1 TABLET BY MOUTH EVERY DAY IN THE EVENING, Disp: 90 tablet, Rfl: 1   Multiple Vitamin (MULTIVITAMIN) tablet, Take 1 tablet by mouth daily., Disp: , Rfl:    cyclobenzaprine (FLEXERIL) 10 MG tablet, Take 1 tablet (10 mg total) by mouth 3 (three) times daily as needed for muscle spasms. (Patient not taking: Reported on 12/06/2022), Disp: 30 tablet, Rfl: 0   folic acid (FOLVITE) 1 MG tablet, Take 1 tablet (1 mg total) by mouth daily. (Patient not taking: Reported on 12/06/2022), Disp: 30 tablet, Rfl: 6   Semaglutide-Weight Management (WEGOVY) 2.4 MG/0.75ML SOAJ, INJECT 2.4 MG INTO THE SKIN ONCE A WEEK., Disp: 9 mL, Rfl: 1   SODIUM FLUORIDE 5000 SENSITIVE 1.1-5 % GEL, Take by mouth daily at 6 (six) AM. (Patient not taking: Reported on 12/06/2022), Disp: , Rfl:    No Known Allergies   Review of Systems  Constitutional: Negative.   Eyes:  Negative for blurred vision.  Respiratory: Negative.  Negative for shortness of breath.   Cardiovascular: Negative.  Negative for chest pain and PND.  Gastrointestinal: Negative.   Musculoskeletal: Negative.   Neurological: Negative.  Negative for  headaches.  Psychiatric/Behavioral: Negative.       Today's Vitals   12/06/22 0859  BP: 120/82  Pulse: 83  Temp: 97.9 F (36.6 C)  SpO2: 99%  Weight: 182 lb 12.8 oz (82.9 kg)  Height: 5\' 3"  (1.6 m)   Body mass index is 32.38 kg/m.  Wt Readings from Last 3 Encounters:  12/06/22 182 lb 12.8 oz (82.9 kg)  08/30/22 182 lb 12.8 oz (82.9 kg)  06/07/22 182 lb 12.8 oz (82.9 kg)     Objective:  Physical Exam Vitals and nursing note reviewed.  Constitutional:      Appearance: Normal appearance.  HENT:     Head: Normocephalic and atraumatic.  Eyes:     Extraocular Movements: Extraocular movements intact.  Cardiovascular:     Rate and Rhythm: Normal rate and regular rhythm.     Heart  sounds: Normal heart sounds.  Pulmonary:     Effort: Pulmonary effort is normal.     Breath sounds: Normal breath sounds.  Musculoskeletal:     Cervical back: Normal range of motion.  Skin:    General: Skin is warm.  Neurological:     General: No focal deficit present.     Mental Status: She is alert.  Psychiatric:        Mood and Affect: Mood normal.        Behavior: Behavior normal.         Assessment And Plan:  Essential hypertension Assessment & Plan: Chronic, controlled. She will conitnue with amlodipine 5mg  daily. Encouraged to follow low sodium diet. She will f/u in 3-6 months.   Orders: -     BMP8+EGFR  Seasonal allergic rhinitis due to pollen Assessment & Plan: Chronic, sx stable on levocetirizine. Encouraged to avoid known triggers.    Pseudopolyposis of colon without complication, unspecified part of colon [facility]) Assessment & Plan: April 2023 colonoscopy results reviewed.- POS for polyps. Next colonoscopy due in 3 years.    Class 1 obesity due to excess calories with serious comorbidity and body mass index (BMI) of 32.0 to 32.9 in adult Assessment & Plan: She will continue with [facility] 2.4mg  weekly. Encouraged to be intentional about her protein intake and to incorporate strength training into her workout routine.    She is encouraged to strive for BMI less than 30 to decrease cardiac risk. Advised to aim for at least 150 minutes of exercise per week.   Return in 12 weeks (on 02/28/2023).  Patient was given opportunity to ask questions. Patient verbalized understanding of the plan and was able to repeat key elements of the plan. All questions were answered to their satisfaction.    I, Heather Aliment, MD, have reviewed all documentation for this visit. The documentation on 12/06/22 for the exam, diagnosis, procedures, and orders are all accurate and complete.   IF YOU HAVE BEEN REFERRED TO A SPECIALIST, IT MAY TAKE 1-2 WEEKS TO SCHEDULE/PROCESS THE REFERRAL.  IF YOU HAVE NOT HEARD FROM US/SPECIALIST IN TWO WEEKS, PLEASE GIVE [place] A CALL AT [phone].   THE PATIENT IS ENCOURAGED TO PRACTICE SOCIAL DISTANCING DUE TO THE COVID-19 PANDEMIC.

## 2022-12-07 LAB — BMP8+EGFR
BUN/Creatinine Ratio: 11 (ref 9–23)
BUN: 9 mg/dL (ref 6–24)
CO2: 21 mmol/L (ref 20–29)
Calcium: 9.1 mg/dL (ref 8.7–10.2)
Chloride: 101 mmol/L (ref 96–106)
Creatinine, Ser: 0.8 mg/dL (ref 0.57–1.00)
Glucose: 70 mg/dL (ref 70–99)
Potassium: 3.9 mmol/L (ref 3.5–5.2)
Sodium: 138 mmol/L (ref 134–144)
eGFR: 91 mL/min/{1.73_m2} (ref >59)

## 2022-12-11 ENCOUNTER — Other Ambulatory Visit: Payer: Self-pay | Admitting: Internal Medicine

## 2022-12-11 DIAGNOSIS — E6609 Other obesity due to excess calories: Secondary | ICD-10-CM

## 2022-12-17 NOTE — Assessment & Plan Note (Signed)
Chronic, sx stable on levocetirizine. Encouraged to avoid known triggers.

## 2022-12-17 NOTE — Assessment & Plan Note (Signed)
Chronic, controlled. She will conitnue with amlodipine 5mg  daily. Encouraged to follow low sodium diet. She will f/u in 3-6 months.

## 2022-12-17 NOTE — Assessment & Plan Note (Signed)
She will continue with [facility] 2.4mg  weekly. Encouraged to be intentional about her protein intake and to incorporate strength training into her workout routine.

## 2022-12-17 NOTE — Assessment & Plan Note (Addendum)
April 2023 colonoscopy results reviewed.- POS for polyps. Next colonoscopy due in 3 years.

## 2022-12-25 ENCOUNTER — Other Ambulatory Visit: Payer: Self-pay | Admitting: Internal Medicine

## 2022-12-25 DIAGNOSIS — Z1231 Encounter for screening mammogram for malignant neoplasm of breast: Secondary | ICD-10-CM

## 2023-01-03 ENCOUNTER — Encounter: Payer: Self-pay | Admitting: Family

## 2023-01-03 ENCOUNTER — Ambulatory Visit
Admission: RE | Admit: 2023-01-03 | Discharge: 2023-01-03 | Disposition: A | Discharge status: Home / Self Care | Payer: 59 | Source: Ambulatory Visit | Attending: Internal Medicine | Admitting: Internal Medicine

## 2023-01-03 DIAGNOSIS — Z1231 Encounter for screening mammogram for malignant neoplasm of breast: Secondary | ICD-10-CM

## 2023-01-05 ENCOUNTER — Other Ambulatory Visit: Payer: Self-pay | Admitting: Internal Medicine

## 2023-02-28 ENCOUNTER — Ambulatory Visit: Payer: 59 | Admitting: Internal Medicine

## 2023-02-28 VITALS — BP 126/86 | HR 96 | Temp 98.4°F | Ht 63.0 in | Wt 183.4 lb

## 2023-02-28 DIAGNOSIS — E66811 Obesity, class 1: Secondary | ICD-10-CM

## 2023-02-28 DIAGNOSIS — I10 Essential (primary) hypertension: Secondary | ICD-10-CM

## 2023-02-28 DIAGNOSIS — E6609 Other obesity due to excess calories: Secondary | ICD-10-CM | POA: Diagnosis not present

## 2023-02-28 DIAGNOSIS — Z23 Encounter for immunization: Secondary | ICD-10-CM | POA: Diagnosis not present

## 2023-02-28 DIAGNOSIS — Z6832 Body mass index (BMI) 32.0-32.9, adult: Secondary | ICD-10-CM

## 2023-02-28 NOTE — Assessment & Plan Note (Signed)
Chronic, she will continue with [facility] 2.4mg  weekly. No weight loss since last visit. Encouraged to incorporate more exercise into her weekly routine. She is interested in starting Pilates.

## 2023-02-28 NOTE — Assessment & Plan Note (Addendum)
Chronic, initially uncontrolled.  She had stressful event at work just prior to appt, no med change today. She will continue with amlodipine 5mg  daily. Encouraged to follow low sodium diet. She will rto in March 2025 for her next physical examination.

## 2023-02-28 NOTE — Patient Instructions (Signed)
Hypertension, Adult Hypertension is another name for high blood pressure. High blood pressure forces your heart to work harder to pump blood. This can cause problems over time. There are two numbers in a blood pressure reading. There is a top number (systolic) over a bottom number (diastolic). It is best to have a blood pressure that is below 120/80. What are the causes? The cause of this condition is not known. Some other conditions can lead to high blood pressure. What increases the risk? Some lifestyle factors can make you more likely to develop high blood pressure: Smoking. Not getting enough exercise or physical activity. Being overweight. Having too much fat, sugar, calories, or salt (sodium) in your diet. Drinking too much alcohol. Other risk factors include: Having any of these conditions: Heart disease. Diabetes. High cholesterol. Kidney disease. Obstructive sleep apnea. Having a family history of high blood pressure and high cholesterol. Age. The risk increases with age. Stress. What are the signs or symptoms? High blood pressure may not cause symptoms. Very high blood pressure (hypertensive crisis) may cause: Headache. Fast or uneven heartbeats (palpitations). Shortness of breath. Nosebleed. Vomiting or feeling like you may vomit (nauseous). Changes in how you see. Very bad chest pain. Feeling dizzy. Seizures. How is this treated? This condition is treated by making healthy lifestyle changes, such as: Eating healthy foods. Exercising more. Drinking less alcohol. Your doctor may prescribe medicine if lifestyle changes do not help enough and if: Your top number is above 130. Your bottom number is above 80. Your personal target blood pressure may vary. Follow these instructions at home: Eating and drinking  If told, follow the DASH eating plan. To follow this plan: Fill one half of your plate at each meal with fruits and vegetables. Fill one fourth of your plate  at each meal with whole grains. Whole grains include whole-wheat pasta, brown rice, and whole-grain bread. Eat or drink low-fat dairy products, such as skim milk or low-fat yogurt. Fill one fourth of your plate at each meal with low-fat (lean) proteins. Low-fat proteins include fish, chicken without skin, eggs, beans, and tofu. Avoid fatty meat, cured and processed meat, or chicken with skin. Avoid pre-made or processed food. Limit the amount of salt in your diet to less than 1,500 mg each day. Do not drink alcohol if: Your doctor tells you not to drink. You are pregnant, may be pregnant, or are planning to become pregnant. If you drink alcohol: Limit how much you have to: 0-1 drink a day for women. 0-2 drinks a day for men. Know how much alcohol is in your drink. In the [place] one drink equals one 12 oz bottle of beer (355 mL), one 5 oz glass of wine (148 mL), or one 1 oz glass of hard liquor (44 mL). Lifestyle  Work with your doctor to stay at a healthy weight or to lose weight. Ask your doctor what the best weight is for you. Get at least 30 minutes of exercise that causes your heart to beat faster (aerobic exercise) most days of the week. This may include walking, swimming, or biking. Get at least 30 minutes of exercise that strengthens your muscles (resistance exercise) at least 3 days a week. This may include lifting weights or doing Pilates. Do not smoke or use any products that contain nicotine or tobacco. If you need help quitting, ask your doctor. Check your blood pressure at home as told by your doctor. Keep all follow-up visits. Medicines Take over-the-counter and prescription medicines   only as told by your doctor. Follow directions carefully. Do not skip doses of blood pressure medicine. The medicine does not work as well if you skip doses. Skipping doses also puts you at risk for problems. Ask your doctor about side effects or reactions to medicines that you should watch  for. Contact a doctor if: You think you are having a reaction to the medicine you are taking. You have headaches that keep coming back. You feel dizzy. You have swelling in your ankles. You have trouble with your vision. Get help right away if: You get a very bad headache. You start to feel mixed up (confused). You feel weak or numb. You feel faint. You have very bad pain in your: Chest. Belly (abdomen). You vomit more than once. You have trouble breathing. These symptoms may be an emergency. Get help right away. Call 911. Do not wait to see if the symptoms will go away. Do not drive yourself to the hospital. Summary Hypertension is another name for high blood pressure. High blood pressure forces your heart to work harder to pump blood. For most people, a normal blood pressure is less than 120/80. Making healthy choices can help lower blood pressure. If your blood pressure does not get lower with healthy choices, you may need to take medicine. This information is not intended to replace advice given to you by your health care provider. Make sure you discuss any questions you have with your health care provider. Document Revised: 01/13/2021 Document Reviewed: 01/13/2021 Elsevier Patient Education  2024 [employer]  

## 2023-02-28 NOTE — Progress Notes (Signed)
I,[name], CMA,acting as a Neurosurgeon for [name], MD.,have documented all relevant documentation on the behalf of [name], MD,as directed by  [name], MD while in the presence of [name], MD.  Subjective:  Patient ID: [name] , female    DOB: [birth year] , 47 y.o.   MRN: [id number]  Chief Complaint  Patient presents with   Hypertension    HPI  She presents today for weight & bp check. She has been using weekly Wegovy without any issues.  She reports compliance with bp medication. Denies headache, chest pain, and SOB. She denies having any  specific questions or concerns.     Hypertension This is a chronic problem. The current episode started more than 1 month ago. The problem has been gradually improving since onset. The problem is controlled. Pertinent negatives include no blurred vision, chest pain, headaches, PND or shortness of breath. Past treatments include calcium channel blockers. The current treatment provides moderate improvement.     Past Medical History:  Diagnosis Date   Allergy    seasonal   Anemia    Fibroids    Hypertension    Sleep apnea    "mild",no cpap     Family History  Problem Relation Age of Onset   Hypertension Mother    Hyperthyroidism Mother    Prostate cancer Maternal Grandfather    Colon cancer Neg Hx    Colon polyps Neg Hx    Celiac disease Neg Hx    Crohn's disease Neg Hx    Esophageal cancer Neg Hx    Rectal cancer Neg Hx    Stomach cancer Neg Hx      Current Outpatient Medications:    amLODipine (NORVASC) 5 MG tablet, TAKE 1 TABLET(5 MG) BY MOUTH AT BEDTIME, Disp: 90 tablet, Rfl: 2   Blood Pressure Monitoring (BLOOD PRESSURE DIGITAL SOLN) KIT, , Disp: , Rfl:    ibuprofen (ADVIL) 800 MG tablet, Take 800 mg by mouth every 6 (six) hours as needed., Disp: , Rfl:    levalbuterol (XOPENEX HFA) 45 MCG/ACT inhaler, Inhale 1-2 puffs into the lungs every 8 (eight) hours as needed for wheezing.,  Disp: 15 g, Rfl: 2   levocetirizine (XYZAL) 5 MG tablet, Take 1 tablet (5 mg total) by mouth as needed. TAKE 1 TABLET BY MOUTH EVERY DAY IN THE EVENING, Disp: 90 tablet, Rfl: 1   Multiple Vitamin (MULTIVITAMIN) tablet, Take 1 tablet by mouth daily., Disp: , Rfl:    Semaglutide-Weight Management (WEGOVY) 2.4 MG/0.75ML SOAJ, INJECT 2.4 MG INTO THE SKIN ONCE A WEEK., Disp: 9 mL, Rfl: 1   cyclobenzaprine (FLEXERIL) 10 MG tablet, Take 1 tablet (10 mg total) by mouth 3 (three) times daily as needed for muscle spasms. (Patient not taking: Reported on 12/06/2022), Disp: 30 tablet, Rfl: 0   folic acid (FOLVITE) 1 MG tablet, Take 1 tablet (1 mg total) by mouth daily. (Patient not taking: Reported on 12/06/2022), Disp: 30 tablet, Rfl: 6   SODIUM FLUORIDE 5000 SENSITIVE 1.1-5 % GEL, Take by mouth daily at 6 (six) AM. (Patient not taking: Reported on 12/06/2022), Disp: , Rfl:    No Known Allergies   Review of Systems  Constitutional: Negative.   Eyes:  Negative for blurred vision.  Respiratory: Negative.  Negative for shortness of breath.   Cardiovascular: Negative.  Negative for chest pain and PND.  Gastrointestinal: Negative.   Neurological: Negative.  Negative for headaches.  Psychiatric/Behavioral: Negative.  Today's Vitals   02/28/23 1105 02/28/23 1133  BP: (!) 140/100 126/86  Pulse: 96   Temp: 98.4 F (36.9 C)   SpO2: 98%   Weight: 183 lb 6.4 oz (83.2 kg)   Height: 5\' 3"  (1.6 m)    Body mass index is 32.49 kg/m.  Wt Readings from Last 3 Encounters:  02/28/23 183 lb 6.4 oz (83.2 kg)  12/06/22 182 lb 12.8 oz (82.9 kg)  08/30/22 182 lb 12.8 oz (82.9 kg)    BP Readings from Last 3 Encounters:  02/28/23 126/86  12/06/22 120/82  08/30/22 126/84     Objective:  Physical Exam Vitals and nursing note reviewed.  Constitutional:      Appearance: Normal appearance.  HENT:     Head: Normocephalic and atraumatic.  Eyes:     Extraocular Movements: Extraocular movements intact.   Cardiovascular:     Rate and Rhythm: Normal rate and regular rhythm.     Heart sounds: Normal heart sounds.  Pulmonary:     Effort: Pulmonary effort is normal.     Breath sounds: Normal breath sounds.  Musculoskeletal:     Cervical back: Normal range of motion.  Skin:    General: Skin is warm.  Neurological:     General: No focal deficit present.     Mental Status: She is alert.  Psychiatric:        Mood and Affect: Mood normal.        Behavior: Behavior normal.         Assessment And Plan:  Essential hypertension Assessment & Plan: Chronic, initially uncontrolled.  She had stressful event at work just prior to appt, no med change today. She will continue with amlodipine 5mg  daily. Encouraged to follow low sodium diet. She will rto in March 2025 for her next physical examination.    Class 1 obesity due to excess calories with body mass index (BMI) of 32.0 to 32.9 in adult, unspecified whether serious comorbidity present Assessment & Plan: Chronic, she will continue with [facility] 2.4mg  weekly. No weight loss since last visit. Encouraged to incorporate more exercise into her weekly routine. She is interested in starting Pilates.    Immunization due -     Flu vaccine trivalent PF, 6mos and older(Flulaval,Afluria,Fluarix,Fluzone) -     Pfizer Comirnaty Covid-19 Vaccine 14yrs & older  She is encouraged to strive for BMI less than 30 to decrease cardiac risk. Advised to aim for at least 150 minutes of exercise per week.    Return if symptoms worsen or fail to improve.  Patient was given opportunity to ask questions. Patient verbalized understanding of the plan and was able to repeat key elements of the plan. All questions were answered to their satisfaction.    I, [name], MD, have reviewed all documentation for this visit. The documentation on 02/28/23 for the exam, diagnosis, procedures, and orders are all accurate and complete.   IF YOU HAVE BEEN REFERRED TO A  SPECIALIST, IT MAY TAKE 1-2 WEEKS TO SCHEDULE/PROCESS THE REFERRAL. IF YOU HAVE NOT HEARD FROM US/SPECIALIST IN TWO WEEKS, PLEASE GIVE [place] A CALL AT [phone].   THE PATIENT IS ENCOURAGED TO PRACTICE SOCIAL DISTANCING DUE TO THE COVID-19 PANDEMIC.

## 2023-06-06 ENCOUNTER — Other Ambulatory Visit: Payer: Self-pay | Admitting: Internal Medicine

## 2023-06-06 DIAGNOSIS — E6609 Other obesity due to excess calories: Secondary | ICD-10-CM

## 2023-06-20 ENCOUNTER — Encounter: Payer: 59 | Admitting: Internal Medicine

## 2023-07-04 ENCOUNTER — Encounter: Payer: Self-pay | Admitting: Internal Medicine

## 2023-07-04 ENCOUNTER — Ambulatory Visit (INDEPENDENT_AMBULATORY_CARE_PROVIDER_SITE_OTHER): Admitting: Internal Medicine

## 2023-07-04 ENCOUNTER — Encounter: Payer: Self-pay | Admitting: Family

## 2023-07-04 VITALS — BP 134/80 | HR 95 | Temp 98.1°F | Ht 63.0 in | Wt 189.4 lb

## 2023-07-04 DIAGNOSIS — I10 Essential (primary) hypertension: Secondary | ICD-10-CM | POA: Diagnosis not present

## 2023-07-04 DIAGNOSIS — R7309 Other abnormal glucose: Secondary | ICD-10-CM | POA: Diagnosis not present

## 2023-07-04 DIAGNOSIS — Z0000 Encounter for general adult medical examination without abnormal findings: Secondary | ICD-10-CM

## 2023-07-04 DIAGNOSIS — E66811 Obesity, class 1: Secondary | ICD-10-CM

## 2023-07-04 DIAGNOSIS — E6609 Other obesity due to excess calories: Secondary | ICD-10-CM

## 2023-07-04 DIAGNOSIS — Z6833 Body mass index (BMI) 33.0-33.9, adult: Secondary | ICD-10-CM

## 2023-07-04 LAB — POCT URINALYSIS DIPSTICK
Bilirubin, UA: NEGATIVE
Blood, UA: NEGATIVE
Glucose, UA: NEGATIVE
Ketones, UA: NEGATIVE
Leukocytes, UA: NEGATIVE
Nitrite, UA: NEGATIVE
Protein, UA: NEGATIVE
Spec Grav, UA: 1.015 (ref 1.010–1.025)
Urobilinogen, UA: 0.2 U/dL
pH, UA: 5.5 (ref 5.0–8.0)

## 2023-07-04 NOTE — Assessment & Plan Note (Signed)

## 2023-07-04 NOTE — Assessment & Plan Note (Signed)
 Chronic, she will continue with [facility] 2.4mg  weekly. No weight loss since last visit. Encouraged to incorporate more strength training into her workout . She is also advised to be intentional about her protein intake.

## 2023-07-04 NOTE — Progress Notes (Signed)
 I,[name], CMA,acting as a Neurosurgeon for [name], MD.,have documented all relevant documentation on the behalf of [name], MD,as directed by  [name], MD while in the presence of [name], MD.  Subjective:    Patient ID: [name] , female    DOB: 12/31/1975 , 48 y.o.   MRN: [id number]  Chief Complaint  Patient presents with   Annual Exam   Hypertension    HPI  She is here today for a full physical examination. She is followed by Dr. Cherly Hensen for her GYN exams. She was last seen yesterday.  She has no specific concerns or complaints at this time. She reports compliance with meds. She denies having any headaches, chest pain and shortness of breath.      Hypertension This is a chronic problem. The current episode started more than 1 month ago. The problem has been gradually improving since onset. The problem is controlled. Pertinent negatives include no blurred vision, chest pain, headaches, PND or shortness of breath. Past treatments include calcium channel blockers. The current treatment provides moderate improvement.     Past Medical History:  Diagnosis Date   Allergy    seasonal   Anemia    Fibroids    Hypertension    Sleep apnea    "mild",no cpap     Family History  Problem Relation Age of Onset   Hypertension Mother    Hyperthyroidism Mother    Prostate cancer Maternal Grandfather    Colon cancer Neg Hx    Colon polyps Neg Hx    Celiac disease Neg Hx    Crohn's disease Neg Hx    Esophageal cancer Neg Hx    Rectal cancer Neg Hx    Stomach cancer Neg Hx      Current Outpatient Medications:    amLODipine (NORVASC) 5 MG tablet, TAKE 1 TABLET(5 MG) BY MOUTH AT BEDTIME, Disp: 90 tablet, Rfl: 2   Blood Pressure Monitoring (BLOOD PRESSURE DIGITAL SOLN) KIT, , Disp: , Rfl:    ibuprofen (ADVIL) 800 MG tablet, Take 800 mg by mouth every 6 (six) hours as needed., Disp: , Rfl:    levalbuterol (XOPENEX HFA) 45 MCG/ACT inhaler,  Inhale 1-2 puffs into the lungs every 8 (eight) hours as needed for wheezing., Disp: 15 g, Rfl: 2   levocetirizine (XYZAL) 5 MG tablet, Take 1 tablet (5 mg total) by mouth as needed. TAKE 1 TABLET BY MOUTH EVERY DAY IN THE EVENING, Disp: 90 tablet, Rfl: 1   Multiple Vitamin (MULTIVITAMIN) tablet, Take 1 tablet by mouth daily., Disp: , Rfl:    Semaglutide-Weight Management (WEGOVY) 2.4 MG/0.75ML SOAJ, INJECT 2.4 MG INTO THE SKIN ONCE A WEEK., Disp: 9 mL, Rfl: 1   cyclobenzaprine (FLEXERIL) 10 MG tablet, Take 1 tablet (10 mg total) by mouth 3 (three) times daily as needed for muscle spasms. (Patient not taking: Reported on 07/04/2023), Disp: 30 tablet, Rfl: 0   folic acid (FOLVITE) 1 MG tablet, Take 1 tablet (1 mg total) by mouth daily. (Patient not taking: Reported on 07/04/2023), Disp: 30 tablet, Rfl: 6   SODIUM FLUORIDE 5000 SENSITIVE 1.1-5 % GEL, Take by mouth daily at 6 (six) AM. (Patient not taking: Reported on 07/04/2023), Disp: , Rfl:    No Known Allergies    The patient states she uses none for birth control (salpingectomy) Patient's last menstrual period was 06/20/2023.Marland Kitchen Positive for Dysmenorrhea. Has uterine fibroids. Negative for: breast discharge, breast lump(s), breast pain and breast  self exam. Associated symptoms include abnormal vaginal bleeding. Pertinent negatives include abnormal bleeding (hematology), anxiety, decreased libido, depression, difficulty falling sleep, dyspareunia, history of infertility, nocturia, sexual dysfunction, sleep disturbances, urinary incontinence, urinary urgency, vaginal discharge and vaginal itching. Diet regular.The patient states her exercise level is    . The patient's tobacco use is:  Social History   Tobacco Use  Smoking Status Never   Passive exposure: Never  Smokeless Tobacco Never  . She has been exposed to passive smoke. The patient's alcohol use is:  Social History   Substance and Sexual Activity  Alcohol Use Yes   Alcohol/week: 2.0  standard drinks of alcohol   Types: 2 Glasses of wine per week   Comment: liquor 2 x a week   Review of Systems  Constitutional: Negative.   HENT: Negative.    Eyes: Negative.  Negative for blurred vision.  Respiratory: Negative.  Negative for shortness of breath.   Cardiovascular: Negative.  Negative for chest pain and PND.  Gastrointestinal: Negative.   Endocrine: Negative.   Genitourinary: Negative.   Musculoskeletal: Negative.   Skin: Negative.   Allergic/Immunologic: Negative.   Neurological: Negative.  Negative for headaches.  Hematological: Negative.   Psychiatric/Behavioral: Negative.       Today's Vitals   07/04/23 1017 07/04/23 1057  BP: 134/88 134/80  Pulse: 95   Temp: 98.1 F (36.7 C)   SpO2: 98%   Weight: 189 lb 6.4 oz (85.9 kg)   Height: 5\' 3"  (1.6 m)    Body mass index is 33.55 kg/m.  Wt Readings from Last 3 Encounters:  07/04/23 189 lb 6.4 oz (85.9 kg)  02/28/23 183 lb 6.4 oz (83.2 kg)  12/06/22 182 lb 12.8 oz (82.9 kg)     Objective:  Physical Exam Vitals and nursing note reviewed.  Constitutional:      Appearance: Normal appearance.  HENT:     Head: Normocephalic and atraumatic.     Right Ear: Tympanic membrane, ear canal and external ear normal. There is no impacted cerumen.     Left Ear: Tympanic membrane, ear canal and external ear normal. There is no impacted cerumen.     Mouth/Throat:     Pharynx: No oropharyngeal exudate or posterior oropharyngeal erythema.  Eyes:     Extraocular Movements: Extraocular movements intact.  Cardiovascular:     Rate and Rhythm: Normal rate and regular rhythm.     Heart sounds: Normal heart sounds.  Pulmonary:     Effort: Pulmonary effort is normal.     Breath sounds: Normal breath sounds.  Chest:  Breasts:    Tanner Score is 5.     Right: Normal.     Left: Normal.  Abdominal:     General: Bowel sounds are normal.     Palpations: Abdomen is soft.  Genitourinary:    Comments: Deferred   Musculoskeletal:        General: Normal range of motion.     Cervical back: Normal range of motion.  Skin:    General: Skin is warm.  Neurological:     General: No focal deficit present.     Mental Status: She is alert.  Psychiatric:        Mood and Affect: Mood normal.        Behavior: Behavior normal.         Assessment And Plan:     Encounter for general adult medical examination w/o abnormal findings Assessment & Plan: A full exam was performed.  Importance  of monthly self breast exams was discussed with the patient.  She is advised to get 30-45 minutes of regular exercise, no less than four to five days per week. Both weight-bearing and aerobic exercises are recommended.  She is advised to follow a healthy diet with at least six fruits/veggies per day, decrease intake of red meat and other saturated fats and to increase fish intake to twice weekly.  Meats/fish should not be fried -- baked, boiled or broiled is preferable. It is also important to cut back on your sugar intake.  Be sure to read labels - try to avoid anything with added sugar, high fructose corn syrup or other sweeteners.  If you must use a sweetener, you can try stevia or monkfruit.  It is also important to avoid artificially sweetened foods/beverages and diet drinks. Lastly, wear SPF 50 sunscreen on exposed skin and when in direct sunlight for an extended period of time.  Be sure to avoid fast food restaurants and aim for at least 60 ounces of water daily.      Orders: -     CBC -     CMP14+EGFR -     Lipid panel -     Hemoglobin A1c -     TSH  Essential hypertension Assessment & Plan: Chronic, fair control. EKG performed, NSR w/o acute changes.  Goal BP<120/80.   She will continue with amlodipine 5mg  daily. Encouraged to follow low sodium diet. She will rto in 4 months for re-evaluation.   Orders: -     POCT urinalysis dipstick -     Microalbumin / creatinine urine ratio -     EKG 12-Lead  Class 1 obesity  due to excess calories with serious comorbidity and body mass index (BMI) of 33.0 to 33.9 in adult Assessment & Plan: Chronic, she will continue with [facility] 2.4mg  weekly. No weight loss since last visit. Encouraged to incorporate more strength training into her workout . She is also advised to be intentional about her protein intake.    She is encouraged to strive for BMI less than 30 to decrease cardiac risk. Advised to aim for at least 150 minutes of exercise per week.    Return for 1 YEAR HM, 4 MONTH ABNORMAL G;UCOSE F/U. Patient was given opportunity to ask questions. Patient verbalized understanding of the plan and was able to repeat key elements of the plan. All questions were answered to their satisfaction.     I, [name], MD, have reviewed all documentation for this visit. The documentation on 07/04/23 for the exam, diagnosis, procedures, and orders are all accurate and complete.

## 2023-07-04 NOTE — Patient Instructions (Signed)

## 2023-07-05 LAB — CMP14+EGFR
ALT: 14 IU/L (ref 0–32)
AST: 16 IU/L (ref 0–40)
Albumin: 4.5 g/dL (ref 3.9–4.9)
Alkaline Phosphatase: 84 IU/L (ref 44–121)
BUN/Creatinine Ratio: 13 (ref 9–23)
BUN: 11 mg/dL (ref 6–24)
Bilirubin Total: 0.3 mg/dL (ref 0.0–1.2)
CO2: 22 mmol/L (ref 20–29)
Calcium: 9.3 mg/dL (ref 8.7–10.2)
Chloride: 104 mmol/L (ref 96–106)
Creatinine, Ser: 0.87 mg/dL (ref 0.57–1.00)
Globulin, Total: 3 g/dL (ref 1.5–4.5)
Glucose: 78 mg/dL (ref 70–99)
Potassium: 4.6 mmol/L (ref 3.5–5.2)
Sodium: 139 mmol/L (ref 134–144)
Total Protein: 7.5 g/dL (ref 6.0–8.5)
eGFR: 83 mL/min/{1.73_m2} (ref >59)

## 2023-07-05 LAB — MICROALBUMIN / CREATININE URINE RATIO
Creatinine, Urine: 58.9 mg/dL
Microalb/Creat Ratio: 7 mg/g{creat} (ref 0–29)
Microalbumin, Urine: 4.1 ug/mL

## 2023-07-05 LAB — LIPID PANEL
Chol/HDL Ratio: 3 ratio (ref 0.0–4.4)
Cholesterol, Total: 153 mg/dL (ref 100–199)
HDL: 51 mg/dL (ref >39)
LDL Chol Calc (NIH): 88 mg/dL (ref 0–99)
Triglycerides: 74 mg/dL (ref 0–149)
VLDL Cholesterol Cal: 14 mg/dL (ref 5–40)

## 2023-07-05 LAB — HEMOGLOBIN A1C
Est. average glucose Bld gHb Est-mCnc: 114 mg/dL
Hgb A1c MFr Bld: 5.6 % (ref 4.8–5.6)

## 2023-07-05 LAB — TSH: TSH: 2.39 u[IU]/mL (ref 0.450–4.500)

## 2023-07-05 LAB — CBC
Hematocrit: 36.9 % (ref 34.0–46.6)
Hemoglobin: 11.2 g/dL (ref 11.1–15.9)
MCH: 23.1 pg — ABNORMAL LOW (ref 26.6–33.0)
MCHC: 30.4 g/dL — ABNORMAL LOW (ref 31.5–35.7)
MCV: 76 fL — ABNORMAL LOW (ref 79–97)
Platelets: 375 10*3/uL (ref 150–450)
RBC: 4.84 x10E6/uL (ref 3.77–5.28)
RDW: 15.3 % (ref 11.7–15.4)
WBC: 5.8 10*3/uL (ref 3.4–10.8)

## 2023-07-08 NOTE — Assessment & Plan Note (Signed)
 Chronic, fair control. EKG performed, NSR w/o acute changes.  Goal BP<120/80.   She will continue with amlodipine 5mg  daily. Encouraged to follow low sodium diet. She will rto in 4 months for re-evaluation.

## 2023-07-09 ENCOUNTER — Encounter: Payer: Self-pay | Admitting: Internal Medicine

## 2023-09-04 ENCOUNTER — Other Ambulatory Visit: Payer: Self-pay | Admitting: Internal Medicine

## 2023-09-04 DIAGNOSIS — I10 Essential (primary) hypertension: Secondary | ICD-10-CM

## 2023-10-09 ENCOUNTER — Other Ambulatory Visit: Payer: Self-pay | Admitting: Internal Medicine

## 2023-11-08 ENCOUNTER — Ambulatory Visit (INDEPENDENT_AMBULATORY_CARE_PROVIDER_SITE_OTHER): Admitting: Internal Medicine

## 2023-11-08 ENCOUNTER — Encounter: Payer: Self-pay | Admitting: Internal Medicine

## 2023-11-08 VITALS — BP 124/82 | HR 98 | Temp 98.3°F | Ht 63.0 in | Wt 191.0 lb

## 2023-11-08 DIAGNOSIS — J301 Allergic rhinitis due to pollen: Secondary | ICD-10-CM

## 2023-11-08 DIAGNOSIS — E6609 Other obesity due to excess calories: Secondary | ICD-10-CM | POA: Diagnosis not present

## 2023-11-08 DIAGNOSIS — R7309 Other abnormal glucose: Secondary | ICD-10-CM

## 2023-11-08 DIAGNOSIS — E66811 Obesity, class 1: Secondary | ICD-10-CM

## 2023-11-08 DIAGNOSIS — I10 Essential (primary) hypertension: Secondary | ICD-10-CM | POA: Diagnosis not present

## 2023-11-08 DIAGNOSIS — Z6833 Body mass index (BMI) 33.0-33.9, adult: Secondary | ICD-10-CM

## 2023-11-08 MED ORDER — LEVALBUTEROL TARTRATE 45 MCG/ACT IN AERO: 1.0000 | INHALATION_SPRAY | Freq: Three times a day (TID) | RESPIRATORY_TRACT | 2 refills | Status: AC | PRN

## 2023-11-08 MED ORDER — WEGOVY 2.4 MG/0.75ML ~~LOC~~ SOAJ: 2.4000 mg | SUBCUTANEOUS | 1 refills | Status: DC

## 2023-11-08 NOTE — Patient Instructions (Signed)
 Hypertension, Adult Hypertension is another name for high blood pressure. High blood pressure forces your heart to work harder to pump blood. This can cause problems over time. There are two numbers in a blood pressure reading. There is a top number (systolic) over a bottom number (diastolic). It is best to have a blood pressure that is below 120/80. What are the causes? The cause of this condition is not known. Some other conditions can lead to high blood pressure. What increases the risk? Some lifestyle factors can make you more likely to develop high blood pressure: Smoking. Not getting enough exercise or physical activity. Being overweight. Having too much fat, sugar, calories, or salt (sodium) in your diet. Drinking too much alcohol. Other risk factors include: Having any of these conditions: Heart disease. Diabetes. High cholesterol. Kidney disease. Obstructive sleep apnea. Having a family history of high blood pressure and high cholesterol. Age. The risk increases with age. Stress. What are the signs or symptoms? High blood pressure may not cause symptoms. Very high blood pressure (hypertensive crisis) may cause: Headache. Fast or uneven heartbeats (palpitations). Shortness of breath. Nosebleed. Vomiting or feeling like you may vomit (nauseous). Changes in how you see. Very bad chest pain. Feeling dizzy. Seizures. How is this treated? This condition is treated by making healthy lifestyle changes, such as: Eating healthy foods. Exercising more. Drinking less alcohol. Your doctor may prescribe medicine if lifestyle changes do not help enough and if: Your top number is above 130. Your bottom number is above 80. Your personal target blood pressure may vary. Follow these instructions at home: Eating and drinking  If told, follow the DASH eating plan. To follow this plan: Fill one half of your plate at each meal with fruits and vegetables. Fill one fourth of your plate  at each meal with whole grains. Whole grains include whole-wheat pasta, brown rice, and whole-grain bread. Eat or drink low-fat dairy products, such as skim milk or low-fat yogurt. Fill one fourth of your plate at each meal with low-fat (lean) proteins. Low-fat proteins include fish, chicken without skin, eggs, beans, and tofu. Avoid fatty meat, cured and processed meat, or chicken with skin. Avoid pre-made or processed food. Limit the amount of salt in your diet to less than 1,500 mg each day. Do not drink alcohol if: Your doctor tells you not to drink. You are pregnant, may be pregnant, or are planning to become pregnant. If you drink alcohol: Limit how much you have to: 0-1 drink a day for women. 0-2 drinks a day for men. Know how much alcohol is in your drink. In the [place] one drink equals one 12 oz bottle of beer (355 mL), one 5 oz glass of wine (148 mL), or one 1 oz glass of hard liquor (44 mL). Lifestyle  Work with your doctor to stay at a healthy weight or to lose weight. Ask your doctor what the best weight is for you. Get at least 30 minutes of exercise that causes your heart to beat faster (aerobic exercise) most days of the week. This may include walking, swimming, or biking. Get at least 30 minutes of exercise that strengthens your muscles (resistance exercise) at least 3 days a week. This may include lifting weights or doing Pilates. Do not smoke or use any products that contain nicotine or tobacco. If you need help quitting, ask your doctor. Check your blood pressure at home as told by your doctor. Keep all follow-up visits. Medicines Take over-the-counter and prescription medicines  only as told by your doctor. Follow directions carefully. Do not skip doses of blood pressure medicine. The medicine does not work as well if you skip doses. Skipping doses also puts you at risk for problems. Ask your doctor about side effects or reactions to medicines that you should watch  for. Contact a doctor if: You think you are having a reaction to the medicine you are taking. You have headaches that keep coming back. You feel dizzy. You have swelling in your ankles. You have trouble with your vision. Get help right away if: You get a very bad headache. You start to feel mixed up (confused). You feel weak or numb. You feel faint. You have very bad pain in your: Chest. Belly (abdomen). You vomit more than once. You have trouble breathing. These symptoms may be an emergency. Get help right away. Call 911. Do not wait to see if the symptoms will go away. Do not drive yourself to the hospital. Summary Hypertension is another name for high blood pressure. High blood pressure forces your heart to work harder to pump blood. For most people, a normal blood pressure is less than 120/80. Making healthy choices can help lower blood pressure. If your blood pressure does not get lower with healthy choices, you may need to take medicine. This information is not intended to replace advice given to you by your health care provider. Make sure you discuss any questions you have with your health care provider. Document Revised: 01/13/2021 Document Reviewed: 01/13/2021 Elsevier Patient Education  2024 [employer].

## 2023-11-08 NOTE — Progress Notes (Signed)
 I,[name], CMA,acting as a Neurosurgeon for Heather LOISE Slocumb, MD.,have documented all relevant documentation on the behalf of Heather LOISE Slocumb, MD,as directed by  Heather LOISE Slocumb, MD while in the presence of Heather LOISE Slocumb, MD.  Subjective:  Patient ID: [name] , female    DOB: Mar 06, 1976 , 48 y.o.   MRN: [id number]  Chief Complaint  Patient presents with   Hypertension    Patient presents today for bp & abnormal glucose follow up.    HPI Discussed the use of AI scribe software for clinical note transcription with the patient, who gave verbal consent to proceed.  History of Present Illness [name] is a 48 year old female with hypertension who presents for a follow-up on blood pressure and weight management.  She has gained eight pounds since November and exercises infrequently, engaging in strength training twice a week. She struggles with maintaining adequate protein intake, consuming canned salmon and occasionally making salmon salad. Breakfast is sometimes a protein shake or an egg with toast, but she often experiences stomach discomfort, making it difficult to eat in the morning. By lunch, she is usually ready to eat more.  She uses Wegovy  for weight management and adjusted her dosage during a vacation to eat more freely. She currently receives a ninety-day supply from [facility].  Her A1c was normal four months ago, and she has been drinking more fluids due to the heat.   Hypertension This is a chronic problem. The current episode started more than 1 month ago. The problem has been gradually improving since onset. The problem is controlled. Pertinent negatives include no blurred vision, chest pain, headaches, PND or shortness of breath. Past treatments include calcium channel blockers. The current treatment provides moderate improvement.     Past Medical History:  Diagnosis Date   Allergy    seasonal   Anemia    Fibroids    Hypertension    Sleep apnea     mild,no cpap     Family History  Problem Relation Age of Onset   Hypertension Mother    Hyperthyroidism Mother    Prostate cancer Maternal Grandfather    Colon cancer Neg Hx    Colon polyps Neg Hx    Celiac disease Neg Hx    Crohn's disease Neg Hx    Esophageal cancer Neg Hx    Rectal cancer Neg Hx    Stomach cancer Neg Hx      Current Outpatient Medications:    amLODipine  (NORVASC ) 5 MG tablet, TAKE 1 TABLET(5 MG) BY MOUTH AT BEDTIME, Disp: 90 tablet, Rfl: 2   Blood Pressure Monitoring (BLOOD PRESSURE DIGITAL SOLN) KIT, , Disp: , Rfl:    ibuprofen  (ADVIL ) 800 MG tablet, Take 800 mg by mouth every 6 (six) hours as needed., Disp: , Rfl:    levocetirizine (XYZAL ) 5 MG tablet, Take 1 tablet (5 mg total) by mouth as needed. TAKE 1 TABLET BY MOUTH EVERY DAY IN THE EVENING, Disp: 90 tablet, Rfl: 1   Multiple Vitamin (MULTIVITAMIN) tablet, Take 1 tablet by mouth daily., Disp: , Rfl:    levalbuterol  (XOPENEX  HFA) 45 MCG/ACT inhaler, Inhale 1-2 puffs into the lungs every 8 (eight) hours as needed for wheezing., Disp: 15 g, Rfl: 2   Semaglutide -Weight Management (WEGOVY ) 2.4 MG/0.75ML SOAJ, Inject 2.4 mg into the skin once a week., Disp: 9 mL, Rfl: 1   No Known Allergies   Review of Systems  Constitutional: Negative.   Eyes:  Negative for blurred  vision.  Respiratory: Negative.  Negative for shortness of breath.   Cardiovascular: Negative.  Negative for chest pain and PND.  Neurological: Negative.  Negative for headaches.  Psychiatric/Behavioral: Negative.       Today's Vitals   11/08/23 1012  BP: 124/82  Pulse: 98  Temp: 98.3 F (36.8 C)  SpO2: 98%  Weight: 191 lb (86.6 kg)  Height: 5' 3 (1.6 m)   Body mass index is 33.83 kg/m.  Wt Readings from Last 3 Encounters:  11/08/23 191 lb (86.6 kg)  07/04/23 189 lb 6.4 oz (85.9 kg)  02/28/23 183 lb 6.4 oz (83.2 kg)     Objective:  Physical Exam Vitals and nursing note reviewed.  Constitutional:      Appearance: Normal  appearance. She is obese.  HENT:     Head: Normocephalic and atraumatic.  Eyes:     Extraocular Movements: Extraocular movements intact.  Cardiovascular:     Rate and Rhythm: Normal rate and regular rhythm.     Heart sounds: Normal heart sounds.  Pulmonary:     Effort: Pulmonary effort is normal.     Breath sounds: Normal breath sounds.  Musculoskeletal:     Cervical back: Normal range of motion.  Skin:    General: Skin is warm.  Neurological:     General: No focal deficit present.     Mental Status: She is alert.  Psychiatric:        Mood and Affect: Mood normal.        Behavior: Behavior normal.         Assessment And Plan:  Class 1 obesity due to excess calories with serious comorbidity and body mass index (BMI) of 33.0 to 33.9 in adult Assessment & Plan: Weight increased by 8 pounds since November. Low exercise and protein intake noted. Advised against pausing Wegovy  for more than two weeks. - Resume Wegovy  as prescribed, avoid pausing for more than two weeks. - Increase exercise frequency, aim for strength training twice a week. - Increase protein intake by substituting mayonnaise with [place] yogurt and adding egg whites to meals. - Reassess weight and A1c in three months.  Orders: -     Wegovy ; Inject 2.4 mg into the skin once a week.  Dispense: 9 mL; Refill: 1  Essential hypertension Assessment & Plan: Chronic, fair control. Goal BP<120/80.   She will continue with amlodipine  5mg  daily. Encouraged to follow low sodium diet. She will rto in 4 months for re-evaluation.    Seasonal allergic rhinitis due to pollen Assessment & Plan: Chronic, sx stable on levocetirizine. Encouraged to avoid known triggers. Will send refill of Xopenex  to use prn.  Orders: -     Levalbuterol  Tartrate; Inhale 1-2 puffs into the lungs every 8 (eight) hours as needed for wheezing.  Dispense: 15 g; Refill: 2   Return in 3 months (on 02/08/2024), or weight check.  Patient was given  opportunity to ask questions. Patient verbalized understanding of the plan and was able to repeat key elements of the plan. All questions were answered to their satisfaction.   I, Heather LOISE Slocumb, MD, have reviewed all documentation for this visit. The documentation on 11/08/23 for the exam, diagnosis, procedures, and orders are all accurate and complete.   IF YOU HAVE BEEN REFERRED TO A SPECIALIST, IT MAY TAKE 1-2 WEEKS TO SCHEDULE/PROCESS THE REFERRAL. IF YOU HAVE NOT HEARD FROM US /SPECIALIST IN TWO WEEKS, PLEASE GIVE US  A CALL AT [phone].   THE PATIENT IS  ENCOURAGED TO PRACTICE SOCIAL DISTANCING DUE TO THE COVID-19 PANDEMIC.

## 2023-11-11 DIAGNOSIS — R7309 Other abnormal glucose: Secondary | ICD-10-CM | POA: Insufficient documentation

## 2023-11-11 NOTE — Assessment & Plan Note (Signed)
 Chronic, fair control. Goal BP<120/80.   She will continue with amlodipine  5mg  daily. Encouraged to follow low sodium diet. She will rto in 4 months for re-evaluation.

## 2023-11-11 NOTE — Assessment & Plan Note (Signed)
 Previous labs reviewed, her A1c has been elevated in the past. I will check an A1c today. Reminded to avoid refined sugars including sugary drinks/foods and processed meats including bacon, sausages and deli meats.

## 2023-11-11 NOTE — Assessment & Plan Note (Signed)
 Weight increased by 8 pounds since November. Low exercise and protein intake noted. Advised against pausing Wegovy  for more than two weeks. - Resume Wegovy  as prescribed, avoid pausing for more than two weeks. - Increase exercise frequency, aim for strength training twice a week. - Increase protein intake by substituting mayonnaise with [place] yogurt and adding egg whites to meals. - Reassess weight and A1c in three months.

## 2023-11-11 NOTE — Assessment & Plan Note (Signed)
 Chronic, sx stable on levocetirizine. Encouraged to avoid known triggers. Will send refill of Xopenex  to use prn.

## 2023-12-13 ENCOUNTER — Other Ambulatory Visit: Payer: Self-pay | Admitting: Internal Medicine

## 2023-12-13 DIAGNOSIS — Z1231 Encounter for screening mammogram for malignant neoplasm of breast: Secondary | ICD-10-CM

## 2024-01-04 ENCOUNTER — Ambulatory Visit
Admission: RE | Admit: 2024-01-04 | Discharge: 2024-01-04 | Disposition: A | Discharge status: Home / Self Care | Source: Ambulatory Visit | Attending: Internal Medicine | Admitting: Internal Medicine

## 2024-01-04 ENCOUNTER — Encounter: Payer: Self-pay | Admitting: Family

## 2024-01-04 DIAGNOSIS — Z1231 Encounter for screening mammogram for malignant neoplasm of breast: Secondary | ICD-10-CM

## 2024-01-09 ENCOUNTER — Other Ambulatory Visit: Payer: Self-pay | Admitting: Internal Medicine

## 2024-01-09 DIAGNOSIS — R928 Other abnormal and inconclusive findings on diagnostic imaging of breast: Secondary | ICD-10-CM

## 2024-01-15 ENCOUNTER — Ambulatory Visit
Admission: RE | Admit: 2024-01-15 | Discharge: 2024-01-15 | Disposition: A | Discharge status: Home / Self Care | Source: Ambulatory Visit | Attending: Internal Medicine | Admitting: Internal Medicine

## 2024-01-15 DIAGNOSIS — R928 Other abnormal and inconclusive findings on diagnostic imaging of breast: Secondary | ICD-10-CM

## 2024-01-30 ENCOUNTER — Encounter: Payer: Self-pay | Admitting: Internal Medicine

## 2024-02-04 ENCOUNTER — Encounter: Payer: Self-pay | Admitting: Internal Medicine

## 2024-02-04 ENCOUNTER — Ambulatory Visit (INDEPENDENT_AMBULATORY_CARE_PROVIDER_SITE_OTHER): Admitting: Internal Medicine

## 2024-02-04 VITALS — BP 124/80 | HR 88 | Temp 98.1°F | Ht 63.0 in | Wt 194.0 lb

## 2024-02-04 DIAGNOSIS — Z6833 Body mass index (BMI) 33.0-33.9, adult: Secondary | ICD-10-CM

## 2024-02-04 DIAGNOSIS — I10 Essential (primary) hypertension: Secondary | ICD-10-CM

## 2024-02-04 DIAGNOSIS — Z23 Encounter for immunization: Secondary | ICD-10-CM

## 2024-02-04 DIAGNOSIS — E66811 Obesity, class 1: Secondary | ICD-10-CM

## 2024-02-04 DIAGNOSIS — Z860109 Personal history of other colon polyps: Secondary | ICD-10-CM

## 2024-02-04 DIAGNOSIS — E6609 Other obesity due to excess calories: Secondary | ICD-10-CM

## 2024-02-04 MED ORDER — WEGOVY 2.4 MG/0.75ML ~~LOC~~ SOAJ: 2.4000 mg | SUBCUTANEOUS | 1 refills | Status: DC

## 2024-02-04 NOTE — Patient Instructions (Signed)

## 2024-02-04 NOTE — Progress Notes (Signed)
 I,[name], CMA,acting as a neurosurgeon for Heather LOISE Slocumb, MD.,have documented all relevant documentation on the behalf of Heather LOISE Slocumb, MD,as directed by  Heather LOISE Slocumb, MD while in the presence of Heather LOISE Slocumb, MD.  Subjective:  Patient ID: [name] , female    DOB: 06-06-1975 , 48 y.o.   MRN: [id number]  Chief Complaint  Patient presents with   Weight Check    She presents today for weight & bp check. She has been using weekly Wegovy  without any issues.  She reports compliance with bp medication. Denies headache, chest pain, SOB. She reports no specific questions or concerns.     Hypertension    HPI Discussed the use of AI scribe software for clinical note transcription with the patient, who gave verbal consent to proceed.  History of Present Illness [name] is a 48 year old female who presents for a six-month follow-up and blood pressure check.  She has experienced a weight gain of eight pounds since the beginning of the year, with five pounds gained since March and three pounds since July. She attributes this to attending multiple homecoming events and decreased physical activity, noting a change in how she feels due to these changes.  She is currently on Wegovy  for weight management and prediabetes, receiving 90-day refills and storing extra doses in her refrigerator.  She received a flu shot today and plans [facility] as it is not yet available at her current location.   Hypertension This is a chronic problem. The current episode started more than 1 month ago. The problem has been gradually improving since onset. The problem is controlled. Pertinent negatives include no blurred vision, chest pain, headaches, PND or shortness of breath. Past treatments include calcium channel blockers. The current treatment provides moderate improvement.     Past Medical History:  Diagnosis Date   Allergy    seasonal   Anemia    Fibroids     Hypertension    Sleep apnea    mild,no cpap     Family History  Problem Relation Age of Onset   Hypertension Mother    Hyperthyroidism Mother    Prostate cancer Maternal Grandfather    Colon cancer Neg Hx    Colon polyps Neg Hx    Celiac disease Neg Hx    Crohn's disease Neg Hx    Esophageal cancer Neg Hx    Rectal cancer Neg Hx    Stomach cancer Neg Hx    Breast cancer Neg Hx      Current Outpatient Medications:    amLODipine  (NORVASC ) 5 MG tablet, TAKE 1 TABLET(5 MG) BY MOUTH AT BEDTIME, Disp: 90 tablet, Rfl: 2   Blood Pressure Monitoring (BLOOD PRESSURE DIGITAL SOLN) KIT, , Disp: , Rfl:    ibuprofen  (ADVIL ) 800 MG tablet, Take 800 mg by mouth every 6 (six) hours as needed., Disp: , Rfl:    levalbuterol  (XOPENEX  HFA) 45 MCG/ACT inhaler, Inhale 1-2 puffs into the lungs every 8 (eight) hours as needed for wheezing., Disp: 15 g, Rfl: 2   levocetirizine (XYZAL ) 5 MG tablet, Take 1 tablet (5 mg total) by mouth as needed. TAKE 1 TABLET BY MOUTH EVERY DAY IN THE EVENING, Disp: 90 tablet, Rfl: 1   Multiple Vitamin (MULTIVITAMIN) tablet, Take 1 tablet by mouth daily., Disp: , Rfl:    semaglutide -weight management (WEGOVY ) 2.4 MG/0.75ML SOAJ SQ injection, Inject 2.4 mg into the skin once a week., Disp: 9  mL, Rfl: 1   No Known Allergies   Review of Systems  Constitutional: Negative.   Eyes:  Negative for blurred vision.  Respiratory: Negative.  Negative for shortness of breath.   Cardiovascular: Negative.  Negative for chest pain and PND.  Neurological: Negative.  Negative for headaches.  Psychiatric/Behavioral: Negative.       Today's Vitals   02/04/24 1532  BP: 124/80  Pulse: 88  Temp: 98.1 F (36.7 C)  SpO2: 98%  Weight: 194 lb (88 kg)  Height: 5' 3 (1.6 m)   Body mass index is 34.37 kg/m.  Wt Readings from Last 3 Encounters:  02/04/24 194 lb (88 kg)  11/08/23 191 lb (86.6 kg)  07/04/23 189 lb 6.4 oz (85.9 kg)     Objective:  Physical Exam Vitals and  nursing note reviewed.  Constitutional:      Appearance: Normal appearance. She is obese.  HENT:     Head: Normocephalic and atraumatic.  Eyes:     Extraocular Movements: Extraocular movements intact.  Cardiovascular:     Rate and Rhythm: Normal rate and regular rhythm.     Heart sounds: Normal heart sounds.  Pulmonary:     Effort: Pulmonary effort is normal.     Breath sounds: Normal breath sounds.  Musculoskeletal:     Cervical back: Normal range of motion.  Skin:    General: Skin is warm.  Neurological:     General: No focal deficit present.     Mental Status: She is alert.  Psychiatric:        Mood and Affect: Mood normal.        Behavior: Behavior normal.         Assessment And Plan:  Class 1 obesity due to excess calories with serious comorbidity and body mass index (BMI) of 33.0 to 33.9 in adult Assessment & Plan: Weight increased by five pounds since March. Current management with Wegovy , also addressing prediabetes. Discussed insurance coverage and titration plan if coverage changes. She has extra supply to manage potential delays in prior authorization. - Monitor insurance coverage and adjust titration plan if necessary.  Orders: -     Wegovy ; Inject 2.4 mg into the skin once a week.  Dispense: 9 mL; Refill: 1  Essential hypertension Assessment & Plan: Chronic, fair control. Goal BP<120/80.   She will continue with amlodipine  5mg  daily. Encouraged to follow low sodium diet. She will rto in 4 months for re-evaluation.   Orders: -     CMP14+EGFR  Immunization due -     Flu vaccine trivalent PF, 6mos and older(Flulaval,Afluria,Fluarix,Fluzone)  Personal history of other colon polyps Assessment & Plan: Chronic, she is due for her next colonoscopy in 2028.   General Health Maintenance Received flu vaccination. [facility] - Administer flu vaccine. - Advise to [facility] vaccine at [facility]. Return in 2 months (on 04/15/2024)  for 2 MONTH WEIGHT CHECK.  Patient was given opportunity to ask questions. Patient verbalized understanding of the plan and was able to repeat key elements of the plan. All questions were answered to their satisfaction.   I, Heather LOISE Slocumb, MD, have reviewed all documentation for this visit. The documentation on 02/04/24 for the exam, diagnosis, procedures, and orders are all accurate and complete.   IF YOU HAVE BEEN REFERRED TO A SPECIALIST, IT MAY TAKE 1-2 WEEKS TO SCHEDULE/PROCESS THE REFERRAL. IF YOU HAVE NOT HEARD FROM US /SPECIALIST IN TWO WEEKS, PLEASE GIVE US  A CALL AT  [phone].   THE PATIENT IS ENCOURAGED TO PRACTICE SOCIAL DISTANCING DUE TO THE COVID-19 PANDEMIC.

## 2024-02-05 ENCOUNTER — Ambulatory Visit: Payer: Self-pay | Admitting: Internal Medicine

## 2024-02-05 LAB — CMP14+EGFR
ALT: 19 IU/L (ref 0–32)
AST: 19 IU/L (ref 0–40)
Albumin: 4.5 g/dL (ref 3.9–4.9)
Alkaline Phosphatase: 85 IU/L (ref 41–116)
BUN/Creatinine Ratio: 8 — ABNORMAL LOW (ref 9–23)
BUN: 7 mg/dL (ref 6–24)
Bilirubin Total: 0.4 mg/dL (ref 0.0–1.2)
CO2: 22 mmol/L (ref 20–29)
Calcium: 9.2 mg/dL (ref 8.7–10.2)
Chloride: 106 mmol/L (ref 96–106)
Creatinine, Ser: 0.88 mg/dL (ref 0.57–1.00)
Globulin, Total: 3.1 g/dL (ref 1.5–4.5)
Glucose: 82 mg/dL (ref 70–99)
Potassium: 4.6 mmol/L (ref 3.5–5.2)
Sodium: 141 mmol/L (ref 134–144)
Total Protein: 7.6 g/dL (ref 6.0–8.5)
eGFR: 81 mL/min/1.73 (ref >59)

## 2024-02-07 DIAGNOSIS — Z860109 Personal history of other colon polyps: Secondary | ICD-10-CM | POA: Insufficient documentation

## 2024-02-07 NOTE — Assessment & Plan Note (Signed)
 Chronic, fair control. Goal BP<120/80.   She will continue with amlodipine  5mg  daily. Encouraged to follow low sodium diet. She will rto in 4 months for re-evaluation.

## 2024-02-07 NOTE — Assessment & Plan Note (Signed)
 Chronic, she is due for her next colonoscopy in 2028.

## 2024-02-07 NOTE — Assessment & Plan Note (Signed)
 Weight increased by five pounds since March. Current management with Wegovy , also addressing prediabetes. Discussed insurance coverage and titration plan if coverage changes. She has extra supply to manage potential delays in prior authorization. - Monitor insurance coverage and adjust titration plan if necessary.

## 2024-03-05 ENCOUNTER — Telehealth: Payer: Self-pay

## 2024-03-05 NOTE — Telephone Encounter (Signed)
 Tried to complete PA. Message: No coverage was found No eligibility was found. Please reconfirm the member's health plan coverage before re-submitting.

## 2024-04-16 ENCOUNTER — Encounter: Payer: Self-pay | Admitting: Internal Medicine

## 2024-04-16 ENCOUNTER — Ambulatory Visit: Payer: Self-pay | Admitting: Internal Medicine

## 2024-04-16 VITALS — BP 122/80 | HR 98 | Temp 98.4°F | Ht 63.0 in | Wt 193.6 lb

## 2024-04-16 DIAGNOSIS — I10 Essential (primary) hypertension: Secondary | ICD-10-CM | POA: Diagnosis not present

## 2024-04-16 DIAGNOSIS — Z6833 Body mass index (BMI) 33.0-33.9, adult: Secondary | ICD-10-CM | POA: Diagnosis not present

## 2024-04-16 DIAGNOSIS — E6609 Other obesity due to excess calories: Secondary | ICD-10-CM | POA: Diagnosis not present

## 2024-04-16 DIAGNOSIS — E66811 Obesity, class 1: Secondary | ICD-10-CM

## 2024-04-16 MED ORDER — WEGOVY 2.4 MG/0.75ML ~~LOC~~ SOAJ: 2.4000 mg | SUBCUTANEOUS | 1 refills | Status: AC

## 2024-04-16 NOTE — Progress Notes (Signed)
 I,[name], CMA,acting as a neurosurgeon for [name], MD.,have documented all relevant documentation on the behalf of [name], MD,as directed by  [name], MD while in the presence of [name], MD.  Subjective:  Patient ID: [name] , female    DOB: 05/30/1975 , 49 y.o.   MRN: [id number]  Chief Complaint  Patient presents with   Weight Check    She presents today for weight & bp check. She has been using weekly Wegovy  without any issues.  She reports compliance with bp medication    HPI Discussed the use of AI scribe software for clinical note transcription with the patient, who gave verbal consent to proceed.  History of Present Illness [name] is a 49 year old female who presents for a blood pressure and weight check.  She is currently on Wegovy  for weight management. I received a letter from [facility] indicating that she needs to [facility] to continue receiving her medication. She plans to check her mail for the letter. [facility] uses [facility] and typically [facility] them [facility] at the [facility] rather than using mail order.  She reports a weight loss of one pound over the holidays, attributing this to alternating her diet and increasing protein intake. She has started consuming protein shakes, specifically a generic brand from [employer], which contains 30 grams of protein and 2 grams of sugar per shake. She finds these helpful as she sometimes does not feel hungry enough to eat solid food. Her bowel movements are regular, typically occurring in the morning after water or coffee, or after lunch if not in the morning. She is trying to incorporate more vegetables into her diet.  For physical activity, she has started using a Pilates board at home, inspired by a friend who attends Pilates classes. She finds it beneficial for her core and is exploring exercises through online resources.  The last Wegovy  refill  was picked up on February 20, 2024, and the next available fill date is May 21, 2024.   Hypertension This is a chronic problem. The current episode started more than 1 month ago. The problem has been gradually improving since onset. The problem is controlled. Pertinent negatives include no blurred vision, chest pain, headaches, PND or shortness of breath. Past treatments include calcium channel blockers. The current treatment provides moderate improvement.     Past Medical History:  Diagnosis Date   Allergy    seasonal   Anemia    Fibroids    Hypertension    Sleep apnea    mild,no cpap     Family History  Problem Relation Age of Onset   Hypertension Mother    Hyperthyroidism Mother    Prostate cancer Maternal Grandfather    Colon cancer Neg Hx    Colon polyps Neg Hx    Celiac disease Neg Hx    Crohn's disease Neg Hx    Esophageal cancer Neg Hx    Rectal cancer Neg Hx    Stomach cancer Neg Hx    Breast cancer Neg Hx      Current Outpatient Medications:    amLODipine  (NORVASC ) 5 MG tablet, TAKE 1 TABLET(5 MG) BY MOUTH AT BEDTIME, Disp: 90 tablet, Rfl: 2   Blood Pressure Monitoring (BLOOD PRESSURE DIGITAL SOLN) KIT, , Disp: , Rfl:    ibuprofen  (ADVIL ) 800 MG tablet, Take 800 mg by mouth every 6 (six) hours as needed.,  Disp: , Rfl:    levalbuterol  (XOPENEX  HFA) 45 MCG/ACT inhaler, Inhale 1-2 puffs into the lungs every 8 (eight) hours as needed for wheezing., Disp: 15 g, Rfl: 2   levocetirizine (XYZAL ) 5 MG tablet, Take 1 tablet (5 mg total) by mouth as needed. TAKE 1 TABLET BY MOUTH EVERY DAY IN THE EVENING, Disp: 90 tablet, Rfl: 1   Multiple Vitamin (MULTIVITAMIN) tablet, Take 1 tablet by mouth daily., Disp: , Rfl:    semaglutide -weight management (WEGOVY ) 2.4 MG/0.75ML SOAJ SQ injection, Inject 2.4 mg into the skin once a week., Disp: 9 mL, Rfl: 1   No Known Allergies   Review of Systems  Constitutional: Negative.   Eyes:  Negative for blurred vision.   Respiratory: Negative.  Negative for shortness of breath.   Cardiovascular: Negative.  Negative for chest pain and PND.  Neurological: Negative.  Negative for headaches.  Psychiatric/Behavioral: Negative.       Today's Vitals   04/16/24 1539  BP: 122/80  Pulse: 98  Temp: 98.4 F (36.9 C)  SpO2: 98%  Weight: 193 lb 9.6 oz (87.8 kg)  Height: 5' 3 (1.6 m)   Body mass index is 34.29 kg/m.  Wt Readings from Last 3 Encounters:  04/16/24 193 lb 9.6 oz (87.8 kg)  02/04/24 194 lb (88 kg)  11/08/23 191 lb (86.6 kg)    The 10-year ASCVD risk score (Arnett DK, et al., 2019) is: 2.1%   Values used to calculate the score:     Age: 42 years     Clinically relevant sex: Female     Is Non-Hispanic African American: Yes     Diabetic: No     Tobacco smoker: No     Systolic Blood Pressure: 122 mmHg     Is BP treated: Yes     HDL Cholesterol: 51 mg/dL     Total Cholesterol: 153 mg/dL  Objective:  Physical Exam Vitals and nursing note reviewed.  Constitutional:      Appearance: Normal appearance.  HENT:     Head: Normocephalic and atraumatic.  Eyes:     Extraocular Movements: Extraocular movements intact.  Cardiovascular:     Rate and Rhythm: Normal rate and regular rhythm.     Heart sounds: Normal heart sounds.  Pulmonary:     Effort: Pulmonary effort is normal.     Breath sounds: Normal breath sounds.  Musculoskeletal:     Cervical back: Normal range of motion.  Skin:    General: Skin is warm.  Neurological:     General: No focal deficit present.     Mental Status: She is alert.  Psychiatric:        Mood and Affect: Mood normal.        Behavior: Behavior normal.         Assessment And Plan:   Assessment & Plan Class 1 obesity due to excess calories with serious comorbidity and body mass index (BMI) of 33.0 to 33.9 in adult Obesity, class 1, BMI 34. On Wegovy  with 1 lb weight loss. Engaging in lifestyle modifications. [facility] for  W[facility]ovy  continuation. - Refilled Wegovy  prescription to initiate prior authorization. - Advised enrollment [facility] for continued Wegovy  use. - Continue lifestyle modifications: increased protein intake, Pilates. - Follow up in 8-10 weeks. - Aim for at least 100 ounces of protein daily. Essential hypertension Chronic, fair control. Goal BP<120/80.   She will continue with amlodipine  5mg  daily. Encouraged to follow low  sodium diet. She will rto in 4 months for re-evaluation.   No orders of the defined types were placed in this encounter.  Return if symptoms worsen or fail to improve.  Patient was given opportunity to ask questions. Patient verbalized understanding of the plan and was able to repeat key elements of the plan. All questions were answered to their satisfaction.    I, [name], MD, have reviewed all documentation for this visit. The documentation on 04/16/2024 for the exam, diagnosis, procedures, and orders are all accurate and complete.   IF YOU HAVE BEEN REFERRED TO A SPECIALIST, IT MAY TAKE 1-2 WEEKS TO SCHEDULE/PROCESS THE REFERRAL. IF YOU HAVE NOT HEARD FROM US /SPECIALIST IN TWO WEEKS, PLEASE GIVE US  A CALL AT [phone].

## 2024-04-16 NOTE — Patient Instructions (Signed)

## 2024-04-20 ENCOUNTER — Encounter: Payer: Self-pay | Admitting: Internal Medicine

## 2024-04-20 NOTE — Assessment & Plan Note (Signed)
 Chronic, fair control. Goal BP<120/80.   She will continue with amlodipine  5mg  daily. Encouraged to follow low sodium diet. She will rto in 4 months for re-evaluation.

## 2024-04-20 NOTE — Assessment & Plan Note (Signed)
 Obesity, class 1, BMI 34. On Wegovy  with 1 lb weight loss. Engaging in lifestyle modifications. [facility] for [facility]y  continuation. - Refilled Wegovy  prescription to initiate prior authorization. - Advised [facility] for continued Wegovy  use. - Continue lifestyle modifications: increased protein intake, Pilates. - Follow up in 8-10 weeks. - Aim for at least 100 ounces of protein daily.

## 2024-07-08 ENCOUNTER — Encounter: Payer: Self-pay | Admitting: Internal Medicine
